# Patient Record
Sex: Male | Born: 1955 | Race: Black or African American | Hispanic: No | Marital: Married | Smoking: Current every day smoker
Health system: Southern US, Community
[De-identification: ages and names within clinical notes are randomized; demographics above are authoritative.]

## PROBLEM LIST (undated history)

## (undated) DIAGNOSIS — E119 Type 2 diabetes mellitus without complications: Secondary | ICD-10-CM

## (undated) DIAGNOSIS — E785 Hyperlipidemia, unspecified: Secondary | ICD-10-CM

## (undated) HISTORY — DX: Hyperlipidemia, unspecified: E78.5

## (undated) HISTORY — DX: Type 2 diabetes mellitus without complications: E11.9

---

## 2001-04-08 ENCOUNTER — Encounter: Payer: Self-pay | Admitting: Emergency Medicine

## 2001-04-08 ENCOUNTER — Emergency Department (HOSPITAL_COMMUNITY): Admission: EM | Admit: 2001-04-08 | Discharge: 2001-04-08 | Payer: Self-pay | Admitting: Emergency Medicine

## 2004-04-26 ENCOUNTER — Emergency Department (HOSPITAL_COMMUNITY): Admission: EM | Admit: 2004-04-26 | Discharge: 2004-04-27 | Payer: Self-pay | Admitting: Emergency Medicine

## 2004-10-11 ENCOUNTER — Emergency Department (HOSPITAL_COMMUNITY): Admission: EM | Admit: 2004-10-11 | Discharge: 2004-10-11 | Payer: Self-pay | Admitting: Emergency Medicine

## 2005-05-12 ENCOUNTER — Emergency Department (HOSPITAL_COMMUNITY): Admission: EM | Admit: 2005-05-12 | Discharge: 2005-05-12 | Payer: Self-pay | Admitting: Emergency Medicine

## 2014-02-26 ENCOUNTER — Encounter (HOSPITAL_COMMUNITY): Payer: Self-pay | Admitting: Emergency Medicine

## 2014-02-26 ENCOUNTER — Emergency Department (HOSPITAL_COMMUNITY)
Admission: EM | Admit: 2014-02-26 | Discharge: 2014-02-27 | Disposition: A | Discharge status: Home / Self Care | Payer: No Typology Code available for payment source | Attending: Emergency Medicine | Admitting: Emergency Medicine

## 2014-02-26 ENCOUNTER — Emergency Department (HOSPITAL_COMMUNITY): Payer: Self-pay

## 2014-02-26 ENCOUNTER — Emergency Department (HOSPITAL_COMMUNITY): Payer: No Typology Code available for payment source

## 2014-02-26 DIAGNOSIS — S298XXA Other specified injuries of thorax, initial encounter: Secondary | ICD-10-CM | POA: Insufficient documentation

## 2014-02-26 DIAGNOSIS — Y9389 Activity, other specified: Secondary | ICD-10-CM | POA: Insufficient documentation

## 2014-02-26 DIAGNOSIS — F17200 Nicotine dependence, unspecified, uncomplicated: Secondary | ICD-10-CM | POA: Insufficient documentation

## 2014-02-26 DIAGNOSIS — Y92410 Unspecified street and highway as the place of occurrence of the external cause: Secondary | ICD-10-CM | POA: Insufficient documentation

## 2014-02-26 MED ORDER — IOHEXOL 350 MG/ML SOLN
50.0000 mL | Freq: Once | INTRAVENOUS | Status: AC | PRN
  Administered 2014-02-26: 50 mL via INTRAVENOUS
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
## 2014-02-26 NOTE — ED Notes (Signed)
Dr.Walden at the bedside.  
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
## 2014-02-26 NOTE — ED Notes (Signed)
Called X-ray to see if patient can transport soon.  Also called CT, they anticipate him to be ready for pick up in 15 minutes. Will update family on plan of care.
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
## 2017-11-27 ENCOUNTER — Emergency Department (HOSPITAL_COMMUNITY): Payer: Self-pay

## 2017-11-27 ENCOUNTER — Emergency Department (HOSPITAL_COMMUNITY)
Admission: EM | Admit: 2017-11-27 | Discharge: 2017-11-27 | Disposition: A | Discharge status: Home / Self Care | Payer: Self-pay | Attending: Emergency Medicine | Admitting: Emergency Medicine

## 2017-11-27 ENCOUNTER — Other Ambulatory Visit: Payer: Self-pay

## 2017-11-27 ENCOUNTER — Encounter (HOSPITAL_COMMUNITY): Payer: Self-pay | Admitting: *Deleted

## 2017-11-27 DIAGNOSIS — Z5189 Encounter for other specified aftercare: Secondary | ICD-10-CM

## 2017-11-27 DIAGNOSIS — F17200 Nicotine dependence, unspecified, uncomplicated: Secondary | ICD-10-CM | POA: Insufficient documentation

## 2017-11-27 DIAGNOSIS — Y939 Activity, unspecified: Secondary | ICD-10-CM | POA: Insufficient documentation

## 2017-11-27 DIAGNOSIS — S50352A Superficial foreign body of left elbow, initial encounter: Secondary | ICD-10-CM | POA: Insufficient documentation

## 2017-11-27 DIAGNOSIS — Y999 Unspecified external cause status: Secondary | ICD-10-CM | POA: Insufficient documentation

## 2017-11-27 DIAGNOSIS — Y929 Unspecified place or not applicable: Secondary | ICD-10-CM | POA: Insufficient documentation

## 2017-11-27 DIAGNOSIS — W458XXA Other foreign body or object entering through skin, initial encounter: Secondary | ICD-10-CM | POA: Insufficient documentation

## 2017-11-27 DIAGNOSIS — W25XXXA Contact with sharp glass, initial encounter: Secondary | ICD-10-CM | POA: Insufficient documentation

## 2017-11-27 MED ORDER — BACITRACIN ZINC 500 UNIT/GM EX OINT
TOPICAL_OINTMENT | Freq: Two times a day (BID) | CUTANEOUS | Status: DC
Start: 2017-11-27 — End: 2017-11-27
  Administered 2017-11-27: 1 via TOPICAL
  Filled 2017-11-27: qty 0.9

## 2017-11-27 MED ORDER — LIDOCAINE-EPINEPHRINE (PF) 2 %-1:200000 IJ SOLN
20.0000 mL | Freq: Once | INTRAMUSCULAR | Status: AC
  Administered 2017-11-27: 20 mL
  Filled 2017-11-27: qty 20

## 2017-11-27 NOTE — ED Notes (Signed)
Patient transported to X-ray 
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
## 2017-11-27 NOTE — ED Notes (Signed)
Left arm elbow cleaned with Safe clens. Suture cart at bedside.

## 2017-12-09 ENCOUNTER — Inpatient Hospital Stay: Payer: Worker's Compensation

## 2021-08-23 ENCOUNTER — Other Ambulatory Visit: Payer: Self-pay

## 2021-08-23 ENCOUNTER — Emergency Department (HOSPITAL_COMMUNITY): Payer: BC Managed Care – PPO

## 2021-08-23 ENCOUNTER — Emergency Department (HOSPITAL_COMMUNITY)
Admission: EM | Admit: 2021-08-23 | Discharge: 2021-08-23 | Disposition: A | Discharge status: Home / Self Care | Payer: BC Managed Care – PPO | Attending: Emergency Medicine | Admitting: Emergency Medicine

## 2021-08-23 DIAGNOSIS — R042 Hemoptysis: Secondary | ICD-10-CM

## 2021-08-23 DIAGNOSIS — R059 Cough, unspecified: Secondary | ICD-10-CM | POA: Insufficient documentation

## 2021-08-23 DIAGNOSIS — F17200 Nicotine dependence, unspecified, uncomplicated: Secondary | ICD-10-CM | POA: Insufficient documentation

## 2021-08-23 LAB — CBC WITH DIFFERENTIAL/PLATELET
Abs Immature Granulocytes: 0.06 10*3/uL (ref 0.00–0.07)
Basophils Absolute: 0.1 10*3/uL (ref 0.0–0.1)
Basophils Relative: 1 %
Eosinophils Absolute: 0.2 10*3/uL (ref 0.0–0.5)
Eosinophils Relative: 2 %
HCT: 42.8 % (ref 39.0–52.0)
Hemoglobin: 14.3 g/dL (ref 13.0–17.0)
Immature Granulocytes: 1 %
Lymphocytes Relative: 35 %
Lymphs Abs: 3.1 10*3/uL (ref 0.7–4.0)
MCH: 30.4 pg (ref 26.0–34.0)
MCHC: 33.4 g/dL (ref 30.0–36.0)
MCV: 91.1 fL (ref 80.0–100.0)
Monocytes Absolute: 1.1 10*3/uL — ABNORMAL HIGH (ref 0.1–1.0)
Monocytes Relative: 12 %
Neutro Abs: 4.4 10*3/uL (ref 1.7–7.7)
Neutrophils Relative %: 49 %
Platelets: 226 10*3/uL (ref 150–400)
RBC: 4.7 MIL/uL (ref 4.22–5.81)
RDW: 12.8 % (ref 11.5–15.5)
WBC: 8.8 10*3/uL (ref 4.0–10.5)
nRBC: 0 % (ref 0.0–0.2)

## 2021-08-23 LAB — COMPREHENSIVE METABOLIC PANEL
ALT: 47 U/L — ABNORMAL HIGH (ref 0–44)
AST: 44 U/L — ABNORMAL HIGH (ref 15–41)
Albumin: 3.4 g/dL — ABNORMAL LOW (ref 3.5–5.0)
Alkaline Phosphatase: 99 U/L (ref 38–126)
Anion gap: 8 (ref 5–15)
BUN: 12 mg/dL (ref 8–23)
CO2: 24 mmol/L (ref 22–32)
Calcium: 9.3 mg/dL (ref 8.9–10.3)
Chloride: 101 mmol/L (ref 98–111)
Creatinine, Ser: 0.88 mg/dL (ref 0.61–1.24)
GFR, Estimated: >60 mL/min (ref >60)
Glucose, Bld: 207 mg/dL — ABNORMAL HIGH (ref 70–99)
Potassium: 4.3 mmol/L (ref 3.5–5.1)
Sodium: 133 mmol/L — ABNORMAL LOW (ref 135–145)
Total Bilirubin: 0.5 mg/dL (ref 0.3–1.2)
Total Protein: 8.4 g/dL — ABNORMAL HIGH (ref 6.5–8.1)

## 2021-08-23 NOTE — ED Provider Notes (Signed)
[facility]-EMERGENCY DEPT Provider Note   CSN: [id number] Arrival date & time: 08/23/21  3662     History Chief Complaint  Patient presents with   Cough   Hemoptysis    Cody Cantrell is a 65 y.o. male.  HPI    65 year old male with no significant medical history comes in with chief complaint of cough and bloody phlegm. Patient does not have a PCP.  He reports that he has been coughing for about 2 or 3 weeks.  During Thanksgiving, he was out in [place] and still had a cough at that time.  Thereafter, his cough worsened and he started having fever.  He went to an urgent care where his COVID and flu test were negative, patient was started on Ceftin and azithromycin.  He is on day 4 of the antibiotics.  2 days ago he started having blood mixed in his phlegm.  Yesterday he noted some clots.  Today the phlegm is clearer than it was yesterday.  Fevers have improved.  Patient reports that he smokes about a pack a week, and has been a heavy smoker for a while.  No night sweats, unexpected weight loss.  Besides [place], no other travel history.  He denies any wheezing.  No past medical history on file.  There are no problems to display for this patient.   No past surgical history on file.     No family history on file.  Social History   Tobacco Use   Smoking status: Every Day  Substance Use Topics   Alcohol use: Yes    Comment: occ   Drug use: No    Home Medications Prior to Admission medications   Not on File    Allergies    Patient has no known allergies.  Review of Systems   Review of Systems  Constitutional:  Positive for activity change.  Respiratory:  Positive for cough. Negative for shortness of breath.   Cardiovascular:  Negative for chest pain.  Allergic/Immunologic: Negative for immunocompromised state.  Neurological:  Negative for dizziness.  Hematological:  Does not bruise/bleed easily.   Physical Exam Updated Vital Signs BP  (!) 112/99   Pulse 71   Temp 99 F (37.2 C) (Oral)   Resp 16   Ht 5\' 7"  (1.702 m)   Wt 90.3 kg   SpO2 98%   BMI 31.17 kg/m   Physical Exam Vitals and nursing note reviewed.  Constitutional:      Appearance: He is well-developed.  HENT:     Head: Atraumatic.  Cardiovascular:     Rate and Rhythm: Normal rate.  Pulmonary:     Effort: Pulmonary effort is normal.     Breath sounds: Rhonchi present.  Musculoskeletal:     Cervical back: Neck supple.  Skin:    General: Skin is warm.  Neurological:     Mental Status: He is alert and oriented to person, place, and time.    ED Results / Procedures / Treatments   Labs (all labs ordered are listed, but only abnormal results are displayed) Labs Reviewed  CBC WITH DIFFERENTIAL/PLATELET - Abnormal; Notable for the following components:      Result Value   Monocytes Absolute 1.1 (*)    All other components within normal limits  COMPREHENSIVE METABOLIC PANEL - Abnormal; Notable for the following components:   Sodium 133 (*)    Glucose, Bld 207 (*)    Total Protein 8.4 (*)    Albumin 3.4 (*)  AST 44 (*)    ALT 47 (*)    All other components within normal limits    EKG None  Radiology DG Chest 2 View  Result Date: 08/23/2021 CLINICAL DATA:  Hemoptysis EXAM: CHEST - 2 VIEW COMPARISON:  02/26/2014 FINDINGS: Mild cardiomegaly. Heterogeneous airspace opacity of the right middle lobe with additional bandlike atelectasis or scarring. Disc degenerative disease of the thoracic spine. IMPRESSION: 1. Heterogeneous airspace opacity of the right middle lobe with additional bandlike atelectasis or scarring. This most likely reflects infection, however underlying mass is difficult to exclude. At minimum, recommend follow-up radiographs in 6-8 weeks to ensure complete radiographic resolution. 2. Mild cardiomegaly. Electronically Signed   By: Jearld Lesch M.D.   On: 08/23/2021 11:48    Procedures Procedures   Medications Ordered in  ED Medications - No data to display  ED Course  I have reviewed the triage vital signs and the nursing notes.  Pertinent labs & imaging results that were available during my care of the patient were reviewed by me and considered in my medical decision making (see chart for details).    MDM Rules/Calculators/A&P                           65 year old male comes in with chief complaint of bloody phlegm. With him is his urgent care paperwork.  At the urgent care patient was febrile and had negative COVID-19 and flu test.  He was started on Ceftin and azithromycin.  He still taking the antibiotics.  After the antibiotics was initiated, he started having some hemoptysis.  The hemoptysis has Ewald, actually improved compared to yesterday.  Patient denies any constitutional's or concerning for paraneoplastic process.  He smokes about a pack a week.  Unfortunately, patient does not have a PCP.  He has not had any kind of screening evaluation done.  His lung exam is overall reassuring besides rhonchorous breath sounds on the right side and there is no hypoxia or respiratory distress.  My suspicion is that his symptoms are likely because of the infectious process.  He however does need a follow-up x-ray and evaluation to ensure that he is improving and if needed can have more advanced imaging like CT scan.  I contacted and spoke with Dr. Elaina Pattee, internal medicine teaching service.  They will graciously contact the patient to get a follow-up.  Final Clinical Impression(s) / ED Diagnoses Final diagnoses:  Hemoptysis    Rx / DC Orders ED Discharge Orders     None        Derwood Kaplan, MD 08/23/21 1404

## 2021-08-23 NOTE — ED Triage Notes (Signed)
Patient reports he was diagnosed with pneumonia on Wednesday, started coughing up blood on Thursday.

## 2021-08-23 NOTE — Discharge Instructions (Addendum)
You are seen in the ER for bloody sputum.  We suspect that given the recent diagnosis of pneumonia and fevers, the bloody phlegm is a sequelae of that infection.  It should clear up on its own.  However, given your age and history of smoking -we do need to follow-up with the primary care doctor to ensure that the x-ray has cleared up.  We have discussed your situation with internal medicine clinic.  Expect a call from them on Monday or Tuesday to set up an appointment.  If you do not hear by Tuesday call the number provided to ensure that you are followed up within a month time.  Return to the ER if you start having worsening cough, high fevers, increased bloody phlegm, severe shortness of breath.

## 2021-08-23 NOTE — ED Provider Notes (Addendum)
Emergency Medicine Provider Triage Evaluation Note  Cody Cantrell , a 65 y.o. male  was evaluated in triage.  Pt complains of productive cough for "awhile" with the last two days being with pink sputum. The patient was recenty diagnosed with pneumonia and placed on two antibiotics. The patients denies any SOB or cough, and actually report he is coughing less. He was concerned for the sputum.  Review of Systems  Positive: Productive cough Negative: Fever, chest pain, SOB  Physical Exam  BP (!) 148/107 (BP Location: Left Arm)   Pulse 86   Temp 99 F (37.2 C) (Oral)   Resp 16   Ht 5\' 7"  (1.702 m)   Wt 90.3 kg   SpO2 99%   BMI 31.17 kg/m  Gen:   Awake, no distress   Resp:  Normal effort, satting 99% on RA, exp wheezing and rhonchi heard in bilateral bases MSK:   Moves extremities without difficulty  Other:  NAD  Medical Decision Making  Medically screening exam initiated at 10:10 AM.  Appropriate orders placed.  Manolito Jurewicz was informed that the remainder of the evaluation will be completed by another provider, this initial triage assessment does not replace that evaluation, and the importance of remaining in the ED until their evaluation is complete.  Labs ordered.   Discussed with Dr. Thurmon Fair who recommended CXR until the patient can be further evaluated in the back.    Deretha Emory, PA-C 08/23/21 1012    14/03/22, PA-C 08/23/21 1022    14/03/22, MD 08/28/21 [phone]

## 2021-08-28 ENCOUNTER — Other Ambulatory Visit: Payer: Self-pay

## 2021-08-28 ENCOUNTER — Encounter: Payer: Self-pay | Admitting: Internal Medicine

## 2021-08-28 ENCOUNTER — Ambulatory Visit (INDEPENDENT_AMBULATORY_CARE_PROVIDER_SITE_OTHER): Payer: BC Managed Care – PPO | Admitting: Internal Medicine

## 2021-08-28 VITALS — BP 130/82 | HR 67 | Temp 98.1°F | Ht 67.0 in | Wt 202.2 lb

## 2021-08-28 DIAGNOSIS — R7401 Elevation of levels of liver transaminase levels: Secondary | ICD-10-CM

## 2021-08-28 DIAGNOSIS — E1169 Type 2 diabetes mellitus with other specified complication: Secondary | ICD-10-CM | POA: Diagnosis not present

## 2021-08-28 DIAGNOSIS — Z299 Encounter for prophylactic measures, unspecified: Secondary | ICD-10-CM

## 2021-08-28 DIAGNOSIS — J189 Pneumonia, unspecified organism: Secondary | ICD-10-CM | POA: Diagnosis not present

## 2021-08-28 DIAGNOSIS — R739 Hyperglycemia, unspecified: Secondary | ICD-10-CM | POA: Diagnosis not present

## 2021-08-28 DIAGNOSIS — J159 Unspecified bacterial pneumonia: Secondary | ICD-10-CM

## 2021-08-28 DIAGNOSIS — E785 Hyperlipidemia, unspecified: Secondary | ICD-10-CM

## 2021-08-28 DIAGNOSIS — Z23 Encounter for immunization: Secondary | ICD-10-CM | POA: Diagnosis not present

## 2021-08-28 NOTE — Patient Instructions (Addendum)
[name], it was a pleasure seeing you today! You endorsed feeling well today. Below are some of the things we talked about this visit. We look forward to seeing you in the follow up appointment!  Today we discussed: Pneumonia: You came to the clinic after going to the ED with pneumonia. Please finish your antibiotics. We will order repeat chest x ray to make sure it has resolved.   Preventive: We gave the flu shot. We will order lab work to make sure you don't have diabetes. We will also refer you to GI doctor for a colonoscopy.   I have ordered the following labs today:  Lab Orders         Acute Viral Hepatitis (HAV, HBV, HCV)         Lipid Profile         HIV antibody (with reflex)         Hemoglobin A1c        Referrals ordered today:   Referral Orders         Ambulatory referral to Gastroenterology       I have ordered the following medication/changed the following medications:   Stop the following medications: There are no discontinued medications.   Start the following medications: No orders of the defined types were placed in this encounter.    Follow-up: We will call you regarding a follow up.   Please make sure to arrive 15 minutes prior to your next appointment. If you arrive late, you may be asked to reschedule.   We look forward to seeing you next time. Please call our clinic at [phone] if you have any questions or concerns. The best time to call is Monday-Friday from 9am-4pm, but there is someone available 24/7. If after hours or the weekend, call the main hospital number and ask for the Internal Medicine Resident On-Call. If you need medication refills, please notify your pharmacy one week in advance and they will send Cody Cantrell a request.  Thank you for letting Cody Cantrell take part in your care. Wishing you the best!  Thank you, Gwenevere Abbot, MD

## 2021-08-28 NOTE — Progress Notes (Signed)
  CC: ED follow up/Establish Care  HPI:  Cody Cantrell is a 65 y.o. male with a past medical history stated below and presents today for ED follow up. Please see problem based assessment and plan for additional details.  Past Medical Hx: No medical hx.   Medications:  No current medications  Family Hx:  No known family hx.   Social History:  Patient lives with wife and mom. He works at a Location [employer]. He has been smoking for 30 years with 1 pack per week. He drinks occasionally with an average of 4 cans of beers per week and 2-3 mix drinks per week.   Review of Systems: ROS negative except for what is noted on the assessment and plan.  Vitals:   08/28/21 1353  BP: 130/82  Pulse: 67  Temp: 98.1 F (36.7 C)  TempSrc: Oral  SpO2: 99%  Weight: 202 lb 3.2 oz (91.7 kg)  Height: 5\' 7"  (1.702 m)   Height: 5\' 7"  (1.702 m)  Physical Exam General: Well-developed, well-nourished, appearing stated age. Head: Normocephalic without scalp lesions.  Eyes: Conjunctivae pink, sclerae white, without icterus.  Mouth and Throat: Lips normal color, without lesions. Moist mucus membrane. Neck: Neck supple with full range of motion (ROM). Lungs: Rhonchi present but no wheeze or rales. Good air movement to the bases.  Cardiovascular: Normal heart sounds, no r/m/g, 2+ pulses in all extremities Abdomen: No TTP, normal bowel sounds MSK: No asymmetry or muscle atrophy. Full range of motion (ROM) of all joints. No injuries noted. Strength 5/5 in all extremities.  Skin: warm, dry good skin turgor, no lesions Neuro: Alert and oriented. CN grossly intact Psych: Normal mood and normal affect     Assessment & Plan:   See Encounters Tab for problem based charting.  Patient seen with Dr. , M.D. [facility] Internal Medicine, PGY-1 Pager: [phone]

## 2021-08-29 LAB — LIPID PANEL
Chol/HDL Ratio: 4.5 ratio (ref 0.0–5.0)
Cholesterol, Total: 176 mg/dL (ref 100–199)
HDL: 39 mg/dL — ABNORMAL LOW (ref >39)
LDL Chol Calc (NIH): 124 mg/dL — ABNORMAL HIGH (ref 0–99)
Triglycerides: 66 mg/dL (ref 0–149)
VLDL Cholesterol Cal: 13 mg/dL (ref 5–40)

## 2021-08-29 LAB — HEMOGLOBIN A1C
Est. average glucose Bld gHb Est-mCnc: 283 mg/dL
Hgb A1c MFr Bld: 11.5 % — ABNORMAL HIGH (ref 4.8–5.6)

## 2021-08-29 LAB — HIV ANTIBODY (ROUTINE TESTING W REFLEX): HIV Screen 4th Generation wRfx: NONREACTIVE

## 2021-08-29 LAB — HEPATITIS B SURFACE ANTIBODY,QUALITATIVE: Hep B Surface Ab, Qual: NONREACTIVE

## 2021-08-29 LAB — HEPATITIS C ANTIBODY: Hep C Virus Ab: <0.1 s/co ratio (ref 0.0–0.9)

## 2021-08-30 DIAGNOSIS — E785 Hyperlipidemia, unspecified: Secondary | ICD-10-CM | POA: Insufficient documentation

## 2021-08-30 DIAGNOSIS — Z299 Encounter for prophylactic measures, unspecified: Secondary | ICD-10-CM | POA: Insufficient documentation

## 2021-08-30 DIAGNOSIS — J159 Unspecified bacterial pneumonia: Secondary | ICD-10-CM | POA: Insufficient documentation

## 2021-08-30 DIAGNOSIS — Z1211 Encounter for screening for malignant neoplasm of colon: Secondary | ICD-10-CM | POA: Insufficient documentation

## 2021-08-30 DIAGNOSIS — R7401 Elevation of levels of liver transaminase levels: Secondary | ICD-10-CM | POA: Insufficient documentation

## 2021-08-30 DIAGNOSIS — E119 Type 2 diabetes mellitus without complications: Secondary | ICD-10-CM | POA: Insufficient documentation

## 2021-08-30 MED ORDER — ROSUVASTATIN CALCIUM 20 MG PO TABS: 20.0000 mg | ORAL_TABLET | Freq: Every day | ORAL | 0 refills | Status: DC

## 2021-08-30 MED ORDER — METFORMIN HCL 500 MG PO TABS: ORAL_TABLET | ORAL | 0 refills | Status: DC

## 2021-08-30 NOTE — Assessment & Plan Note (Addendum)
In this visit, a flu vaccine, hep C, HIV and a colonoscopy were ordered. Patient stated he had both of his COVID shots.

## 2021-08-30 NOTE — Assessment & Plan Note (Addendum)
Assessment/Plan: Patient had elevated liver enzymes. Patient denied any abdominal pain or other GI symptoms. DDx included NASH vs alcoholic hepatitis vs hepatits B vs hepatitis C. 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 His alcohol use may be contributing as well. Patient agreed to further workup including a RUQ ultrasound.  -Hep B, Hep C -TSH pending -RUQ ultrasound  Addendum: Hepatitis B and C are negative. Patient informed of the results.

## 2021-08-30 NOTE — Assessment & Plan Note (Addendum)
Assessment/Plan: Patient was found to have hyperlipidemia on screening labs. He denied having a family hx of hyperlipidemia. His ASCVD risk was calculated and determined to be 29%. -Lifestyle modifications -Crestor 20 mg qd -Follow up in one month  Addendum: Patient was called regarding the result and informed of the new medication. He agreed to a follow up in one month.

## 2021-08-30 NOTE — Assessment & Plan Note (Signed)
Assessment/Plan: Patient had hyperglycemia on CMP in the ED. He denied any family hx of diabetes or symptoms consistent with diabetes. -A1c ordered  Addendum: A1c was 11.5. I informed the patient about his A1c results and started him on Metformin 1000 mg with instructions to increase it in increments of 500 mg weekly until dose of 1000 mg BID was reached. Advised patient to make lifestyle changes and follow up in the clinic in one month.

## 2021-08-30 NOTE — Assessment & Plan Note (Signed)
Assessment/Plan: Patient presents after an ED follow up for bacterial pneumonia. His symptoms have resolved except for a mild cough. He still has 2 doses of the abx left. Advised patient to complete antibiotics and we will repeat CXR in one month. -Complete abx -Repeat CXR in one month

## 2021-09-01 NOTE — Progress Notes (Signed)
Internal Medicine Clinic Attending  I saw and evaluated the patient.  I personally confirmed the key portions of the history and exam documented by Dr. Welton Flakes and I reviewed pertinent patient test results.  The assessment, diagnosis, and plan were formulated together and I agree with the documentation in the resident's note. Lungs sound clear on exam, patient reports no further hemoptysis and near resolution of cough. Plan for repeat CXR in 6 weeks to evaluate resolution of prior findings given inability to rule out mass on previous CXR.

## 2021-09-29 ENCOUNTER — Encounter: Payer: Self-pay | Admitting: Internal Medicine

## 2021-09-29 ENCOUNTER — Ambulatory Visit (INDEPENDENT_AMBULATORY_CARE_PROVIDER_SITE_OTHER): Payer: BC Managed Care – PPO | Admitting: Internal Medicine

## 2021-09-29 ENCOUNTER — Other Ambulatory Visit: Payer: Self-pay

## 2021-09-29 ENCOUNTER — Ambulatory Visit
Admission: RE | Admit: 2021-09-29 | Discharge: 2021-09-29 | Disposition: A | Discharge status: Home / Self Care | Payer: BC Managed Care – PPO | Source: Ambulatory Visit | Attending: Internal Medicine | Admitting: Internal Medicine

## 2021-09-29 VITALS — BP 114/74 | HR 62 | Temp 98.5°F | Resp 24 | Ht 67.0 in | Wt 197.0 lb

## 2021-09-29 DIAGNOSIS — K760 Fatty (change of) liver, not elsewhere classified: Secondary | ICD-10-CM | POA: Diagnosis not present

## 2021-09-29 DIAGNOSIS — R7401 Elevation of levels of liver transaminase levels: Secondary | ICD-10-CM

## 2021-09-29 DIAGNOSIS — J159 Unspecified bacterial pneumonia: Secondary | ICD-10-CM | POA: Diagnosis not present

## 2021-09-29 DIAGNOSIS — E1169 Type 2 diabetes mellitus with other specified complication: Secondary | ICD-10-CM | POA: Diagnosis not present

## 2021-09-29 DIAGNOSIS — Z299 Encounter for prophylactic measures, unspecified: Secondary | ICD-10-CM

## 2021-09-29 DIAGNOSIS — E785 Hyperlipidemia, unspecified: Secondary | ICD-10-CM

## 2021-09-29 NOTE — Assessment & Plan Note (Signed)
Patient is due for pneumococcal vaccine, shingles vaccine, COVID-vaccine.  Discussed vaccines with patient today.  Patient will consider and let us know next appointment.  Referral to ophthalmology ordered today.  Urine microalbumin ordered and collected today.

## 2021-09-29 NOTE — Progress Notes (Signed)
°  CC: Follow-up for new diagnosis of type 2 diabetes and hyperlipidemia  HPI:  Cody Cantrell is a 66 y.o. male with a past medical history stated below and presents today for follow-up from type 2 diabetes and hyperlipidemia. Please see problem based assessment and plan for additional details.  Past Medical History:  Diagnosis Date   Hyperlipidemia    Type 2 diabetes mellitus (HCC)     Current Outpatient Medications on File Prior to Visit  Medication Sig Dispense Refill   metFORMIN (GLUCOPHAGE) 500 MG tablet Take 1 tablet (500 mg total) by mouth 2 (two) times daily with a meal for 7 days, THEN 1.5 tablets (750 mg total) 2 (two) times daily with a meal for 7 days, THEN 2 tablets (1,000 mg total) 2 (two) times daily with a meal. 219 tablet 0   rosuvastatin (CRESTOR) 20 MG tablet Take 1 tablet (20 mg total) by mouth daily. 90 tablet 0   No current facility-administered medications on file prior to visit.    Social History: Works for a Aeronautical engineer.  Review of Systems: ROS negative except for what is noted on the assessment and plan.  Vitals:   09/29/21 0957 09/29/21 1001  BP: 120/82 114/74  Pulse: 62 62  Resp: (!) 24   Temp: 98.5 F (36.9 C)   TempSrc: Oral   SpO2: 100%   Weight: 197 lb (89.4 kg)   Height: 5\' 7"  (1.702 m)      Physical Exam: General: Well appearing African-American male, NAD HENT: normocephalic, atraumatic, MMM EYES: conjunctiva non-erythematous, no scleral icterus CV: regular rate, normal rhythm, no murmurs, rubs, gallops. Pulmonary: normal work of breathing on RA, lungs clear to auscultation, no rales, wheezes, rhonchi Abdominal: non-distended, soft, non-tender to palpation, normal BS Skin: Warm and dry, no rashes or lesions Neurological: MS: awake, alert and oriented x3, normal speech and fund of knowledge Motor: moves all extremities antigravity Psych: normal affect  Foot exam documented in chart    Assessment & Plan:   See Encounters  Tab for problem based charting.  Patient discussed with Dr. , M.D. [facility] Internal Medicine, PGY-1 Pager: [phone] Date 09/29/2021 Time 12:13 PM

## 2021-09-29 NOTE — Assessment & Plan Note (Signed)
Completed course of antibiotics.  No further respiratory symptoms.

## 2021-09-29 NOTE — Patient Instructions (Signed)
Thank you, [name] for allowing [place] to provide your care today. Today we discussed:  Type 2 diabetes mellitus: Thank you for bringing in your blood sugars today.  Looks like you are doing well on your metformin 1000 mg twice daily.  Continue to take your current regimen.  I expect your loose stools will continue to improve.  If needed you may take Imodium which you can get at a local pharmacy to help with loose bowel movements.  Today we did a foot exam.  I have also referred you to an ophthalmologist to do an eye exam.  We have also done a urine protein study to take a look at how your kidneys are doing.  I have also referred you to our diabetes coordinator.  You should expect a call from our office to schedule a meeting with her.  Also expect a call from ophthalmology to set up an appointment.  High cholesterol: Continue taking your rosuvastatin.  Vaccines: You are due for a pneumococcal vaccine to prevent pneumonia.  You are also due for a shingles vaccine to prevent the shingles rash.  You are also due for a COVID booster shot.  Let us know if you are interested in these vaccines.  The COVID shot and shingles shot you would get at a local pharmacy.  I have ordered the following labs for you:   Lab Orders         Microalbumin / Creatinine Urine Ratio      My Chart Access: [email or web address]  Please follow-up in 6 months  Please make sure to arrive 15 minutes prior to your next appointment. If you arrive late, you may be asked to reschedule.    We look forward to seeing you next time. Please call our clinic at [phone] if you have any questions or concerns. The best time to call is Monday-Friday from 9am-4pm, but there is someone available 24/7. If after hours or the weekend, call the main hospital number and ask for the Internal Medicine Resident On-Call. If you need medication refills, please notify your pharmacy one week in advance and  they will send [place] a request.   Thank you for letting us take part in your care. Wishing you the best!  Ellison Carwin, MD 09/29/2021, 10:41 AM IM Resident, PGY-1

## 2021-09-29 NOTE — Assessment & Plan Note (Signed)
See assessment and plan under NAFL

## 2021-09-29 NOTE — Assessment & Plan Note (Addendum)
Patient presents for OV for follow-up after new diagnosis of type 2 diabetes mellitus.  Patient currently on metformin 1000 mg twice daily.  Patient endorses adherence to this, steadily increased his dose to 4 tablets daily.  Has had some loose stools since starting metformin, reports these are getting better though still having every day loose stools.  Discussed intermittent use of Imodium as needed for loose stools, expect this will improve.  Patient has a glucometer, and checks his daily morning fasting glucose.  He brings his log today.  On review patient's CBGs 150s-200 in early December, 110s-160s late December.  Today he denies polydipsia, burning tingling numbness in hands and feet, blurry vision.  Discussed importance of diabetic diet and exercise.  Performed foot exam today.  Discussed importance of yearly eye exams and yearly urine test.  Plan: -Continue metformin 1000 mg twice daily, Imodium as needed for loose stools -Referral to ophthalmology -Urine microalbumin today -Foot exam completed today -Referral to diabetes coordinator -Continue CBG checks -Continue lifestyle modifications  ADDENDUM: Microalbumin/creatinine ratio 19 (normal)

## 2021-09-29 NOTE — Assessment & Plan Note (Signed)
Last lipid panel 08/28/2021 with cholesterol 176, triglycerides 66, HDL 39, LDL 124.  Patient was started on rosuvastatin 20 daily at last OV.  Patient endorses adherence.  Most effective medication.  Plan: -Continue Crestor 20 mg daily -Lifestyle modifications

## 2021-09-29 NOTE — Assessment & Plan Note (Addendum)
Patient has history of mild transaminitis. CMP from 08/23/2021 showed AST 44, ALT 47, alk phos 99, total bilirubin 0.5.  At last OV, hep C, hep B, and RUQ ultrasound were ordered.  Hep C and hep B negative.  RUQ ultrasound showed diffuse increased echogenicity of the hepatic parenchyma which is a nonspecific indicator of hepatocellular dysfunction, most commonly seen in steatosis.  Discussed ultrasound findings with patient.  Discussed continued lifestyle modifications.  Plan: -Continue treatment of T2DM and HLD -Continue lifestyle modifications -Consider repeat LFTs in 3 to 6 months after initiation of above changes to guide further evaluation of transaminitis and ultrasound findings.

## 2021-09-30 LAB — MICROALBUMIN / CREATININE URINE RATIO
Creatinine, Urine: 148.7 mg/dL
Microalb/Creat Ratio: 19 mg/g creat (ref 0–29)
Microalbumin, Urine: 27.7 ug/mL

## 2021-09-30 NOTE — Progress Notes (Signed)
Internal Medicine Clinic Attending ? ?Case discussed with Dr. Zinoviev  At the time of the visit.  We reviewed the resident?s history and exam and pertinent patient test results.  I agree with the assessment, diagnosis, and plan of care documented in the resident?s note.  ?

## 2021-10-03 ENCOUNTER — Telehealth: Payer: Self-pay | Admitting: Internal Medicine

## 2021-10-03 NOTE — Telephone Encounter (Signed)
Called to remind Cody Cantrell of need for repeat CXR.  He states he will come in next week to have this completed. Message forwarded to PCP for f/u.

## 2021-10-07 ENCOUNTER — Other Ambulatory Visit: Payer: Self-pay

## 2021-10-07 ENCOUNTER — Ambulatory Visit (HOSPITAL_COMMUNITY)
Admission: RE | Admit: 2021-10-07 | Discharge: 2021-10-07 | Disposition: A | Discharge status: Home / Self Care | Payer: BC Managed Care – PPO | Source: Ambulatory Visit | Attending: Internal Medicine | Admitting: Internal Medicine

## 2021-10-07 DIAGNOSIS — J189 Pneumonia, unspecified organism: Secondary | ICD-10-CM | POA: Diagnosis present

## 2021-10-21 ENCOUNTER — Encounter: Payer: BC Managed Care – PPO | Admitting: Dietician

## 2021-11-04 ENCOUNTER — Other Ambulatory Visit: Payer: Self-pay | Admitting: *Deleted

## 2021-11-04 DIAGNOSIS — E785 Hyperlipidemia, unspecified: Secondary | ICD-10-CM

## 2021-11-04 DIAGNOSIS — E1169 Type 2 diabetes mellitus with other specified complication: Secondary | ICD-10-CM

## 2021-11-04 NOTE — Telephone Encounter (Signed)
Pt was here at the office requesting refills on his meds. Reminded pt the doctors are given 48 hrs to refill meds - he understands. Also informed [facility]

## 2021-11-06 MED ORDER — ROSUVASTATIN CALCIUM 20 MG PO TABS: 20.0000 mg | ORAL_TABLET | Freq: Every day | ORAL | 1 refills | Status: DC

## 2021-11-06 MED ORDER — METFORMIN HCL 500 MG PO TABS: 1000.0000 mg | ORAL_TABLET | Freq: Two times a day (BID) | ORAL | 7 refills | Status: DC

## 2021-11-06 NOTE — Telephone Encounter (Signed)
Called pt to inform him of refills but vm has not been set up, unable to leave a message.

## 2021-12-26 ENCOUNTER — Other Ambulatory Visit: Payer: Self-pay | Admitting: Student

## 2021-12-26 DIAGNOSIS — E1169 Type 2 diabetes mellitus with other specified complication: Secondary | ICD-10-CM

## 2021-12-30 NOTE — Telephone Encounter (Signed)
Please schedule pt a f/u visit per Dr Humphrey Rolls. ?Thanks ?

## 2022-02-10 ENCOUNTER — Emergency Department (HOSPITAL_COMMUNITY)
Admission: EM | Admit: 2022-02-10 | Discharge: 2022-02-11 | Disposition: A | Discharge status: Home / Self Care | Payer: BC Managed Care – PPO | Attending: Student | Admitting: Student

## 2022-02-10 DIAGNOSIS — Z7984 Long term (current) use of oral hypoglycemic drugs: Secondary | ICD-10-CM | POA: Diagnosis not present

## 2022-02-10 DIAGNOSIS — M79644 Pain in right finger(s): Secondary | ICD-10-CM | POA: Diagnosis not present

## 2022-02-10 DIAGNOSIS — F17210 Nicotine dependence, cigarettes, uncomplicated: Secondary | ICD-10-CM | POA: Insufficient documentation

## 2022-02-10 DIAGNOSIS — T7840XA Allergy, unspecified, initial encounter: Secondary | ICD-10-CM | POA: Diagnosis present

## 2022-02-10 DIAGNOSIS — L509 Urticaria, unspecified: Secondary | ICD-10-CM

## 2022-02-10 DIAGNOSIS — E119 Type 2 diabetes mellitus without complications: Secondary | ICD-10-CM | POA: Diagnosis not present

## 2022-02-10 MED ORDER — KETOROLAC TROMETHAMINE 15 MG/ML IJ SOLN
15.0000 mg | Freq: Once | INTRAMUSCULAR | Status: AC
  Administered 2022-02-10: 15 mg via INTRAVENOUS
  Filled 2022-02-10: qty 1

## 2022-02-10 MED ORDER — METHYLPREDNISOLONE SODIUM SUCC 125 MG IJ SOLR
125.0000 mg | Freq: Once | INTRAMUSCULAR | Status: AC
  Administered 2022-02-10: 125 mg via INTRAVENOUS
  Filled 2022-02-10: qty 2

## 2022-02-10 MED ORDER — FAMOTIDINE IN NACL 20-0.9 MG/50ML-% IV SOLN
20.0000 mg | Freq: Once | INTRAVENOUS | Status: AC
  Administered 2022-02-10: 20 mg via INTRAVENOUS
  Filled 2022-02-10: qty 50

## 2022-02-10 NOTE — ED Provider Notes (Signed)
11:54 PM Assumed care from Dr. Matilde Sprang, please see their note for full history, physical and decision making until this point. In brief this is a 66 y.o. year old male who presented to the ED tonight with Allergic Reaction     Apparently reacted to a wasp sting. Rash. Got meds around 8. Cleared at midnight.   On reeval, patient appears well. No airway issues. Lungs clear. Rash gone. No GI symptoms. No globus sensation or other abnormalities. Stable for d/c with PRN Benadryl.  Discharge instructions, including strict return precautions for new or worsening symptoms, given. Patient and/or family verbalized understanding and agreement with the plan as described.   Labs, studies and imaging reviewed by myself and considered in medical decision making if ordered. Imaging interpreted by radiology.  Labs Reviewed - No data to display  No orders to display    No follow-ups on file.    Merrily Pew, MD 02/10/22 [phone]

## 2022-02-10 NOTE — ED Triage Notes (Signed)
Pt BIB EMS for a bee sting about 2 hours ago, pt  took 50 mg benadryl prior to EMS arrival. Hives noted on the pt's chest, denies SOB and throat swelling. 0.3mg  epi IM given and decrease in hives  Pt reports a similar episode 6 years ago   90 palp, 106/64 74 HR  14 RR 95 RA  166 CBG

## 2022-02-10 NOTE — ED Provider Notes (Signed)
[facility] EMERGENCY DEPARTMENT Provider Note  CSN: [id number] Arrival date & time: 02/10/22 2124  Chief Complaint(s) Allergic Reaction  HPI Cody Cantrell is a 66 y.o. male with PMH T2DM, HLD who presents emergency department for evaluation of an allergic reaction.  Patient states that approximately 1 hour prior to arrival he was stung on the finger by a wasp.  He had progressive onset of hives and called EMS.  At no point did he have any shortness of breath, wheezing or throat swelling, but the patient did receive 0.3 mg of IM epinephrine and the patient took 50 mg of p.o. Benadryl prior to arrival.  He denies chest pain, shortness of breath, headache, fever, abdominal pain or other systemic symptoms.   Past Medical History Past Medical History:  Diagnosis Date   Hyperlipidemia    Type 2 diabetes mellitus (HCC)    Patient Active Problem List   Diagnosis Date Noted   NAFL (nonalcoholic fatty liver) 09/29/2021   Bacterial pneumonia 08/30/2021   Diabetes mellitus, type 2 (HCC) 08/30/2021   Hyperlipidemia 08/30/2021   Preventive measure 08/30/2021   Transaminitis 08/30/2021   Home Medication(s) Prior to Admission medications   Medication Sig Start Date End Date Taking? Authorizing Provider  metFORMIN (GLUCOPHAGE) 1000 MG tablet Take 1 tablet (1,000 mg total) by mouth 2 (two) times daily with a meal. 12/30/21 03/30/22  Gwenevere Abbot, MD  rosuvastatin (CRESTOR) 20 MG tablet Take 1 tablet (20 mg total) by mouth daily. 11/06/21   Steffanie Rainwater, MD                                                                                                                                    Past Surgical History No past surgical history on file. Family History No family history on file.  Social History Social History   Tobacco Use   Smoking status: Every Day    Packs/day: 0.10    Types: Cigarettes   Tobacco comments:    1 pk per week   Substance Use Topics   Alcohol  use: Yes    Comment: occ   Drug use: No   Allergies Patient has no known allergies.  Review of Systems Review of Systems  Skin:  Positive for rash.   Physical Exam Vital Signs  I have reviewed the triage vital signs BP 116/80   Pulse 76   Temp 97.9 F (36.6 C) (Oral)   Ht 5\' 7"  (1.702 m)   Wt 84.8 kg   SpO2 93%   BMI 29.29 kg/m   Physical Exam Vitals and nursing note reviewed.  Constitutional:      General: He is not in acute distress.    Appearance: He is well-developed.  HENT:     Head: Normocephalic and atraumatic.  Eyes:     Conjunctiva/sclera: Conjunctivae normal.  Cardiovascular:     Rate and Rhythm: Normal rate and regular rhythm.  Heart sounds: No murmur heard. Pulmonary:     Effort: Pulmonary effort is normal. No respiratory distress.     Breath sounds: Normal breath sounds.  Abdominal:     Palpations: Abdomen is soft.     Tenderness: There is no abdominal tenderness.  Musculoskeletal:        General: Tenderness (Third digit) present. No swelling.     Cervical back: Neck supple.  Skin:    General: Skin is warm and dry.     Capillary Refill: Capillary refill takes less than 2 seconds.     Findings: Rash (Urticaria) present.  Neurological:     Mental Status: He is alert.  Psychiatric:        Mood and Affect: Mood normal.    ED Results and Treatments Labs (all labs ordered are listed, but only abnormal results are displayed) Labs Reviewed - No data to display                                                                                                                        Radiology No results found.  Pertinent labs & imaging results that were available during my care of the patient were reviewed by me and considered in my medical decision making (see MDM for details).  Medications Ordered in ED Medications  famotidine (PEPCID) IVPB 20 mg premix (20 mg Intravenous New Bag/Given 02/10/22 2204)  methylPREDNISolone sodium succinate  (SOLU-MEDROL) 125 mg/2 mL injection 125 mg (125 mg Intravenous Given 02/10/22 2158)  ketorolac (TORADOL) 15 MG/ML injection 15 mg (15 mg Intravenous Given 02/10/22 2158)                                                                                                                                     Procedures Procedures  (including critical care time)  Medical Decision Making / ED Course   This patient presents to the ED for concern of allergic reaction, this involves an extensive number of treatment options, and is a complaint that carries with it a high risk of complications and morbidity.  The differential diagnosis includes allergic reaction, urticaria, anaphylaxis  MDM: Patient seen emergency room for evaluation of allergic reaction after a wasp sting.  Physical exam reveals tenderness of the third digit at the site of the wasp sting and urticaria over the chest and back but no oropharyngeal swelling, no wheezing or other physical exam signs of  anaphylaxis.  Patient given steroids and Pepcid as well as Toradol for his hand pain.  Patient will require an observation stay in the emergency department after epinephrine administration and will be safe for discharge at midnight.  Patient then signed out on come provider.  Please see provider signout for continuation of work-up.  Anticipate discharge home.   Additional history obtained: -Additional history obtained from multiple family members -External records from outside source obtained and reviewed including: Chart review including previous notes, labs, imaging, consultation notes    Medicines ordered and prescription drug management: Meds ordered this encounter  Medications   methylPREDNISolone sodium succinate (SOLU-MEDROL) 125 mg/2 mL injection 125 mg   famotidine (PEPCID) IVPB 20 mg premix   ketorolac (TORADOL) 15 MG/ML injection 15 mg    -I have reviewed the patients home medicines and have made adjustments as needed  Critical  interventions none   Cardiac Monitoring: The patient was maintained on a cardiac monitor.  I personally viewed and interpreted the cardiac monitored which showed an underlying rhythm of: NSR  Social Determinants of Health:  Factors impacting patients care include: none   Reevaluation: After the interventions noted above, I reevaluated the patient and found that they have :improved  Co morbidities that complicate the patient evaluation  Past Medical History:  Diagnosis Date   Hyperlipidemia    Type 2 diabetes mellitus (HCC)       Dispostion: I considered admission for this patient, and disposition pending completion of observation period.  Please see provider note for continuation of work-up anticipate discharge.     Final Clinical Impression(s) / ED Diagnoses Final diagnoses:  None     @PCDICTATION @    Glendora ScoreKommor, Sarahmarie Leavey, MD 02/10/22 2318

## 2022-02-18 NOTE — Progress Notes (Signed)
   CC: follow up  HPI:  [name] is a 66 y.o. with medical history as below presenting to [facility] for follow up.  Please see problem-based list for further details, assessments, and plans.  Past Medical History:  Diagnosis Date   Hyperlipidemia    Type 2 diabetes mellitus (HCC)    Review of Systems:  Review of system negative unless stated in the problem list or HPI.    Physical Exam:  Vitals:   02/19/22 1437  BP: 116/63  Pulse: 64  Temp: 98.1 F (36.7 C)  TempSrc: Oral  SpO2: 99%  Weight: 193 lb 12.8 oz (87.9 kg)  Height: 5\' 7"  (1.702 m)    Physical Exam General: Well-developed, NAD Head: Normocephalic without scalp lesions.  Eyes: PERRLA, Conjunctivae pink, sclerae white, without icterus.  Mouth and Throat: Lips normal color, without lesions. Moist mucus membrane. Neck: Neck supple with full range of motion (ROM). No masses or tenderness. Jugular venous distension (JVD) normal. Trachea midline.  Lungs: CTAB, no wheeze, rhonchi or rales.  Cardiovascular: Normal heart sounds, no r/m/g, 2+ pulses in all extremities. No LE edema Abdomen: No TTP, normal bowel sounds MSK: No asymmetry or muscle atrophy. Full range of motion (ROM) of all joints. No injuries noted. Strength 5/5 in all extremities.  Skin: warm, dry good skin turgor, no lesions noted Neuro: Alert and oriented. CN grossly intact Psych: Normal mood and normal affect   Assessment & Plan:   See Encounters Tab for problem based charting.  Patient discussed with Dr. , MD

## 2022-02-19 ENCOUNTER — Other Ambulatory Visit: Payer: Self-pay

## 2022-02-19 ENCOUNTER — Ambulatory Visit (INDEPENDENT_AMBULATORY_CARE_PROVIDER_SITE_OTHER): Payer: BC Managed Care – PPO | Admitting: Internal Medicine

## 2022-02-19 ENCOUNTER — Encounter: Payer: Self-pay | Admitting: Internal Medicine

## 2022-02-19 VITALS — BP 116/63 | HR 64 | Temp 98.1°F | Ht 67.0 in | Wt 193.8 lb

## 2022-02-19 DIAGNOSIS — E1169 Type 2 diabetes mellitus with other specified complication: Secondary | ICD-10-CM | POA: Diagnosis not present

## 2022-02-19 DIAGNOSIS — E785 Hyperlipidemia, unspecified: Secondary | ICD-10-CM

## 2022-02-19 DIAGNOSIS — R7401 Elevation of levels of liver transaminase levels: Secondary | ICD-10-CM

## 2022-02-19 DIAGNOSIS — Z299 Encounter for prophylactic measures, unspecified: Secondary | ICD-10-CM

## 2022-02-19 DIAGNOSIS — Z7984 Long term (current) use of oral hypoglycemic drugs: Secondary | ICD-10-CM

## 2022-02-19 DIAGNOSIS — Z1211 Encounter for screening for malignant neoplasm of colon: Secondary | ICD-10-CM

## 2022-02-19 LAB — POCT GLYCOSYLATED HEMOGLOBIN (HGB A1C): Hemoglobin A1C: 7 % — AB (ref 4.0–5.6)

## 2022-02-19 LAB — GLUCOSE, CAPILLARY: Glucose-Capillary: 97 mg/dL (ref 70–99)

## 2022-02-19 NOTE — Patient Instructions (Signed)
Cody Cantrell, it was a pleasure seeing you today! You endorsed feeling well today. Below are some of the things we talked about this visit. We look forward to seeing you in the follow up appointment!  Today we discussed: It was a pleasure taking care of you!  You were here for a follow-up for your diabetes and high cholesterol.  Your repeat A1c was 7 which is a significant improvement.  I would like to congratulate you on this achievement.  Please continue taking the metformin.  We will check some labs today and make any adjustments to your cholesterol medications if needed.  We will also refer you to gastroenterology for colonoscopy.  I encourage you to continue the dietary changes that you have made and start exercising to improve the weight loss and maximize the health benefits.  I have ordered the following labs today:   Lab Orders         Glucose, capillary         Lipid Profile         CMP14 + Anion Gap         POC Hbg A1C       Referrals ordered today:    Referral Orders         Ambulatory referral to Gastroenterology      I have ordered the following medication/changed the following medications:   Stop the following medications: There are no discontinued medications.   Start the following medications: No orders of the defined types were placed in this encounter.    Follow-up: around 6 month follow up with me   Please make sure to arrive 15 minutes prior to your next appointment. If you arrive late, you may be asked to reschedule.   We look forward to seeing you next time. Please call our clinic at [phone] if you have any questions or concerns. The best time to call is Monday-Friday from 9am-4pm, but there is someone available 24/7. If after hours or the weekend, call the main hospital number and ask for the Internal Medicine Resident On-Call. If you need medication refills, please notify your pharmacy one week in advance and they will send [place] a request.  Thank  you for letting us take part in your care. Wishing you the best!  Thank you, Gwenevere Abbot, MD

## 2022-02-20 LAB — LIPID PANEL
Chol/HDL Ratio: 2.4 ratio (ref 0.0–5.0)
Cholesterol, Total: 105 mg/dL (ref 100–199)
HDL: 44 mg/dL (ref >39)
LDL Chol Calc (NIH): 49 mg/dL (ref 0–99)
Triglycerides: 47 mg/dL (ref 0–149)
VLDL Cholesterol Cal: 12 mg/dL (ref 5–40)

## 2022-02-20 LAB — CMP14 + ANION GAP
ALT: 20 IU/L (ref 0–44)
AST: 19 IU/L (ref 0–40)
Albumin/Globulin Ratio: 1.7 (ref 1.2–2.2)
Albumin: 4.3 g/dL (ref 3.8–4.8)
Alkaline Phosphatase: 77 IU/L (ref 44–121)
Anion Gap: 17 mmol/L (ref 10.0–18.0)
BUN/Creatinine Ratio: 11 (ref 10–24)
BUN: 9 mg/dL (ref 8–27)
Bilirubin Total: <0.2 mg/dL (ref 0.0–1.2)
CO2: 22 mmol/L (ref 20–29)
Calcium: 9.3 mg/dL (ref 8.6–10.2)
Chloride: 103 mmol/L (ref 96–106)
Creatinine, Ser: 0.83 mg/dL (ref 0.76–1.27)
Globulin, Total: 2.6 g/dL (ref 1.5–4.5)
Glucose: 80 mg/dL (ref 70–99)
Potassium: 4 mmol/L (ref 3.5–5.2)
Sodium: 142 mmol/L (ref 134–144)
Total Protein: 6.9 g/dL (ref 6.0–8.5)
eGFR: 98 mL/min/{1.73_m2} (ref >59)

## 2022-02-21 NOTE — Assessment & Plan Note (Signed)
Resolved on recent CMP with AST/ALT of 19/20.

## 2022-02-21 NOTE — Assessment & Plan Note (Signed)
Assessment/Plan Controlled. A1c 7.0 today, 11.5 in 08/2021. CBG 97 today.  Improving on current regimen. Reports good medication compliance. Tolerating medication without adverse effects. Counseled on the benefits of daily exercise, limiting processed foods and high sugar foods, and weight loss. No hypoglycemic episodes. Denies polydipsia, polyuria, weight loss, lethargy, blurry vision, and changes in sensation. No wounds or ulcer of bilateral feet. Benign physical exam and vitals wnl.   -Continue metformin 1000 mg BID -Continue lifestyle modifications -Foot exam done and normal with no lesions, good pulses, and intact sensation.

## 2022-02-21 NOTE — Assessment & Plan Note (Signed)
Preventive Medicine Eye exam-Opthalmopalogy visit upcoming Colonoscopy-GI referral placed this visit Shingles-counseled on getting at pharmacy

## 2022-02-21 NOTE — Assessment & Plan Note (Signed)
Patient has HLD with goal of secondary prevention. Last lipid panel on 08/2021 showed Chol 176, LDL 124 . Reports compliance to Crestor 20 mg without adverse effects. Counseled on the importance of continued lifestyle modifications, including: weight loss, daily exercise, and healthy diet with limited processed and fatty foods. Lipid panel ordered showed  Chole 105, LDL 49 -Continue Crestor 20 mg daily -Encouraged to continue lifestyle modifications

## 2022-02-23 MED ORDER — ROSUVASTATIN CALCIUM 20 MG PO TABS: 20.0000 mg | ORAL_TABLET | Freq: Every day | ORAL | 3 refills | Status: DC

## 2022-02-23 NOTE — Addendum Note (Signed)
Addended by: Gwenevere Abbot on: 02/23/2022 09:10 AM   Modules accepted: Orders

## 2022-02-23 NOTE — Progress Notes (Signed)
Internal Medicine Clinic Attending  Case discussed with Dr. Khan  At the time of the visit.  We reviewed the resident's history and exam and pertinent patient test results.  I agree with the assessment, diagnosis, and plan of care documented in the resident's note.  

## 2022-02-23 NOTE — Addendum Note (Signed)
Addended by: Charise Killian on: 02/23/2022 09:52 AM   Modules accepted: Level of Service

## 2022-02-24 ENCOUNTER — Ambulatory Visit (INDEPENDENT_AMBULATORY_CARE_PROVIDER_SITE_OTHER): Payer: BC Managed Care – PPO | Admitting: Student

## 2022-02-24 ENCOUNTER — Encounter: Payer: Self-pay | Admitting: Student

## 2022-02-24 DIAGNOSIS — L03012 Cellulitis of left finger: Secondary | ICD-10-CM | POA: Diagnosis not present

## 2022-02-24 MED ORDER — MUPIROCIN 2 % EX OINT: 1.0000 "application " | TOPICAL_OINTMENT | Freq: Three times a day (TID) | CUTANEOUS | 1 refills | Status: DC

## 2022-02-24 NOTE — Assessment & Plan Note (Signed)
Patient presents today for evaluation of possible infection around left ring fingernail. Patient reports he has had some swelling around his left middle finger for about 3 to 4 weeks.  He denies any trauma to the finger.  He does not have significant pain around the finger and has been able to work without any problems. However last night, he noticed some drainage coming from the nailbed. The swelling has slightly progressed over the last few days. He denies any systemic symptoms such as fevers, chills, muscle aches, loss of appetite or nausea. On exam, patient has mild edema, warmth and erythema around the nailbed of the left third finger consistent with paronychia. There is no fluctuant or drainable abscess.  Finger is neurovascularly intact. Normal range of motion. Plan to manage his conservatively for now and consider I&D if patient develops significant abscess around the nailbed.  Patient advised to wear gloves at all times during work.  Plan: -Start bacitracin ointment, apply 3 times daily -Instructed to soak finger in warm water up to 3-4 times a day -Apply warm compression to finger to help with drainage -Advised to follow-up in clinic if symptoms worsen or if you develop systemic symptoms

## 2022-02-24 NOTE — Patient Instructions (Signed)
Thank you, [name] for allowing [place] to provide your care today. Today we discussed your finger swelling. You are starting to develop something called paronychia, which can affect the toenails or the fingernails.  Follow the following instructions:  -- Soak your finger in warm water up to 3-4 times a day --Apply warm compression to the finger to help it drain  I have ordered the following medication/changed the following medications:  Started on bacitracin ointment, applied to finger 3 times daily  My Chart Access: [email or web address]  Please follow-up as needed  Please make sure to arrive 15 minutes prior to your next appointment. If you arrive late, you may be asked to reschedule.    We look forward to seeing you next time. Please call our clinic at [phone] if you have any questions or concerns. The best time to call is Monday-Friday from 9am-4pm, but there is someone available 24/7. If after hours or the weekend, call the main hospital number and ask for the Internal Medicine Resident On-Call. If you need medication refills, please notify your pharmacy one week in advance and they will send [place] a request.   Thank you for letting us take part in your care. Wishing you the best!  Steffanie Rainwater, MD 02/24/2022, 10:42 AM IM Resident, PGY-2 Duwayne Heck 41:10

## 2022-02-24 NOTE — Progress Notes (Signed)
   CC: Swelling of ring finger  HPI:  [name] is a 65 y.o. male with PMH as below who presents to clinic with a complaint of infected left ring finger. Please see problem based charting for evaluation, assessment and plan.  Past Medical History:  Diagnosis Date   Hyperlipidemia    Type 2 diabetes mellitus (HCC)     Review of Systems:  Constitutional: Negative for fever or chills. MSK: Positive for swelling and redness around the left ring fingernail. Negative for hand pain or trauma. Neuro: Negative for numbness and tingling of the finger.  Physical Exam: General: Pleasant, well-appearing elderly man.  No acute distress. Cardiac: RRR. No murmurs, rubs or gallops. No LE edema Respiratory: Lungs CTAB. No wheezing or crackles. MSK: Mild edema, warmth and erythema around nailbed of L 3rd finger. No fluctuance, purulent or drainable abscess. No ttp of the nailbed. NVI Skin: Warm, dry and intact without rashes or lesions Neuro: A&O x 3. Moves all extremities. Normal sensation to gross touch.   Vitals:   02/24/22 0959 02/24/22 1014  BP: (!) 156/135 112/76  Pulse: 75 68  Temp: 98.2 F (36.8 C)   TempSrc: Oral   SpO2: 98%   Weight: 192 lb (87.1 kg)   Height: 5\' 7"  (1.702 m)      Assessment & Plan:   See Encounters Tab for problem based charting.  Patient discussed with Dr.  , MD, MPH

## 2022-02-25 NOTE — Progress Notes (Signed)
Internal Medicine Clinic Attending  Case discussed with Dr. [place]  At the time of the visit.  We reviewed the resident's history and exam and pertinent patient test results.  I agree with the assessment, diagnosis, and plan of care documented in the resident's note.  

## 2022-03-02 ENCOUNTER — Other Ambulatory Visit: Payer: Self-pay | Admitting: Internal Medicine

## 2022-03-02 DIAGNOSIS — E1169 Type 2 diabetes mellitus with other specified complication: Secondary | ICD-10-CM

## 2022-03-02 NOTE — Telephone Encounter (Signed)
Patient requesting that medication be sent to a different Pharmacy.  [facility] [address].

## 2022-03-02 NOTE — Telephone Encounter (Signed)
Pt states his medications were sent to the wrong pharmacy and needs it to be corrected. Pt  requesting that Both of the following prescriptions be sent to    metFORMIN (GLUCOPHAGE) 1000 MG tablet mupirocin ointment (BACTROBAN) 2 %  [id number] - [place], [place] - [address]

## 2022-03-04 MED ORDER — MUPIROCIN 2 % EX OINT: 1.0000 "application " | TOPICAL_OINTMENT | Freq: Three times a day (TID) | CUTANEOUS | 1 refills | Status: DC

## 2022-03-04 MED ORDER — METFORMIN HCL 1000 MG PO TABS: 1000.0000 mg | ORAL_TABLET | Freq: Two times a day (BID) | ORAL | 0 refills | Status: DC

## 2022-04-09 LAB — HM DIABETES EYE EXAM

## 2022-04-27 ENCOUNTER — Encounter: Payer: Self-pay | Admitting: Dietician

## 2022-05-11 ENCOUNTER — Emergency Department (HOSPITAL_COMMUNITY)
Admission: EM | Admit: 2022-05-11 | Discharge: 2022-05-12 | Disposition: A | Discharge status: Home / Self Care | Payer: BC Managed Care – PPO | Attending: Emergency Medicine | Admitting: Emergency Medicine

## 2022-05-11 ENCOUNTER — Other Ambulatory Visit: Payer: Self-pay

## 2022-05-11 DIAGNOSIS — Z7984 Long term (current) use of oral hypoglycemic drugs: Secondary | ICD-10-CM | POA: Insufficient documentation

## 2022-05-11 DIAGNOSIS — T782XXA Anaphylactic shock, unspecified, initial encounter: Secondary | ICD-10-CM

## 2022-05-11 DIAGNOSIS — R220 Localized swelling, mass and lump, head: Secondary | ICD-10-CM | POA: Diagnosis not present

## 2022-05-11 DIAGNOSIS — T63441A Toxic effect of venom of bees, accidental (unintentional), initial encounter: Secondary | ICD-10-CM | POA: Diagnosis not present

## 2022-05-11 DIAGNOSIS — E119 Type 2 diabetes mellitus without complications: Secondary | ICD-10-CM | POA: Insufficient documentation

## 2022-05-11 MED ORDER — FAMOTIDINE IN NACL 20-0.9 MG/50ML-% IV SOLN
20.0000 mg | Freq: Once | INTRAVENOUS | Status: AC
  Administered 2022-05-11: 20 mg via INTRAVENOUS
  Filled 2022-05-11: qty 50

## 2022-05-11 MED ORDER — EPINEPHRINE 0.3 MG/0.3ML IJ SOAJ: 0.3000 mg | INTRAMUSCULAR | 0 refills | Status: AC | PRN

## 2022-05-11 MED ORDER — METHYLPREDNISOLONE SODIUM SUCC 125 MG IJ SOLR
125.0000 mg | Freq: Once | INTRAMUSCULAR | Status: AC
  Administered 2022-05-11: 125 mg via INTRAVENOUS
  Filled 2022-05-11: qty 2

## 2022-05-11 MED ORDER — LIDOCAINE 5 % EX PTCH
1.0000 | MEDICATED_PATCH | CUTANEOUS | Status: DC
  Administered 2022-05-11: 1 via TRANSDERMAL
  Filled 2022-05-11: qty 1

## 2022-05-11 MED ORDER — PREDNISONE 20 MG PO TABS: 20.0000 mg | ORAL_TABLET | Freq: Every day | ORAL | 0 refills | Status: AC

## 2022-05-11 NOTE — ED Provider Notes (Signed)
[facility] EMERGENCY DEPARTMENT Provider Note   CSN: [id number] Arrival date & time: 05/11/22  2052     History  Chief Complaint  Patient presents with   Allergic Reaction    Cody Cantrell is a 66 y.o. male.  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 Pt reports he was outside when suddenly he was stung by a bee in his left side.  He immediately started itching.  He took 50mg  of benadryl at home but swelling and itching got worse. When EMS arrived pt had facial swelling, tachypnea and began getting GI sx with hypotension.  He was given epi and fluid bolus.  Now pt reports he feels better.  He denies any SOB or tongue swelling or voice changes.  He did not receive epi pen prescription with last episode.  Still having stinging and pain at the site of sting.  The history is provided by the patient.  Allergic Reaction      Home Medications Prior to Admission medications   Medication Sig Start Date End Date Taking? Authorizing Provider  metFORMIN (GLUCOPHAGE) 1000 MG tablet Take 1 tablet (1,000 mg total) by mouth 2 (two) times daily with a meal. 03/04/22 06/02/22  08/02/22, MD  mupirocin ointment (BACTROBAN) 2 % Apply 1 application  topically 3 (three) times daily. Apply to finger three times daily 03/04/22   03/06/22, MD  rosuvastatin (CRESTOR) 20 MG tablet Take 1 tablet (20 mg total) by mouth daily. 02/23/22 02/23/23  04/25/23, MD      Allergies    Bee venom    Review of Systems   Review of Systems  Physical Exam Updated Vital Signs BP 126/74   Pulse 97   Temp (!) 97.5 F (36.4 C) (Temporal)   Resp 19   SpO2 96%  Physical Exam Vitals and nursing note reviewed.  Constitutional:      General: He is not in acute distress.    Appearance: He is well-developed.  HENT:     Head: Normocephalic and atraumatic.     Mouth/Throat:     Comments: No tongue  swelling or uvular edema Eyes:     Conjunctiva/sclera: Conjunctivae normal.     Pupils: Pupils are equal, round, and reactive to light.  Cardiovascular:     Rate and Rhythm: Normal rate and regular rhythm.     Heart sounds: No murmur heard. Pulmonary:     Effort: Pulmonary effort is normal. No respiratory distress.     Breath sounds: Normal breath sounds. No wheezing or rales.  Chest:    Abdominal:     General: There is no distension.     Palpations: Abdomen is soft.     Tenderness: There is no abdominal tenderness. There is no guarding or rebound.  Musculoskeletal:        General: No tenderness. Normal range of motion.     Cervical back: Normal range of motion and neck supple.  Skin:    General: Skin is warm and dry.     Findings: No erythema or rash.  Neurological:     Mental Status: He is alert and oriented to person, place, and time.  Psychiatric:        Behavior: Behavior normal.     ED Results / Procedures / Treatments   Labs (all labs ordered are listed, but only abnormal results are displayed) Labs  Reviewed - No data to display  EKG None  Radiology No results found.  Procedures Procedures    Medications Ordered in ED Medications  lidocaine (LIDODERM) 5 % 1 patch (1 patch Transdermal Patch Applied 05/11/22 2117)  methylPREDNISolone sodium succinate (SOLU-MEDROL) 125 mg/2 mL injection 125 mg (125 mg Intravenous Given 05/11/22 2117)  famotidine (PEPCID) IVPB 20 mg premix (0 mg Intravenous Stopped 05/11/22 2153)    ED Course/ Medical Decision Making/ A&P                           Medical Decision Making Risk Prescription drug management.   Pt presenting after anaphlaxsis to bee sting tonight at 6:30pm.  Pt seen by ems and received IVF and epi pen at 2033.  Pt on arrival here is feeling better.  VS stable but initially hypertensive most likely related to epi.  Pt has still uricaria and edema where bee sting was.  Placed lidoderm patch and pt given  solumedrol, pepcid.  Pt is clear at 12:30.  11:39 PM Pt still feeling well at this time.  Will receive epipen ppx and short course of steroids for localized reaction from bee sting as well.        Final Clinical Impression(s) / ED Diagnoses Final diagnoses:  None    Rx / DC Orders ED Discharge Orders     None         Cody Sprout, MD 05/11/22 2341

## 2022-05-11 NOTE — Discharge Instructions (Addendum)
You can use benadryl every 6 hours as needed for itching and rash.  You can start the short course of steroids tomorrow for the swelling and pain where the bee stung you.

## 2022-05-11 NOTE — ED Triage Notes (Addendum)
Pt here from home via [facility] after getting stung by a bee on L flank approx 1.5 hours ago. Pt had hives on his hands at first, took 50mg  benadryl. Pt denies shob, but per EMS pt had increased WOB w/ wheezing. Pt's BP decreased to 80/45, EMS gave NS, 0.5mg  epi (@ 2033), 5mg  albuterol. Pt denies pain, last BP 116/84.

## 2022-05-12 NOTE — ED Provider Notes (Signed)
Patient has been monitored in the ER for several hours without any deterioration.  He is feeling improved. He is now safe for discharge home.  I discussed at length with patient and his family about appropriate use of EpiPen and when to call 911.  Prescriptions had already been sent to his pharmacy   Zadie Rhine, MD 05/12/22 [phone]

## 2022-06-10 ENCOUNTER — Other Ambulatory Visit: Payer: Self-pay | Admitting: Student

## 2022-06-10 DIAGNOSIS — E785 Hyperlipidemia, unspecified: Secondary | ICD-10-CM

## 2022-06-10 DIAGNOSIS — E1169 Type 2 diabetes mellitus with other specified complication: Secondary | ICD-10-CM

## 2022-06-22 ENCOUNTER — Ambulatory Visit: Payer: BC Managed Care – PPO

## 2022-06-25 ENCOUNTER — Emergency Department (HOSPITAL_COMMUNITY): Payer: BC Managed Care – PPO

## 2022-06-25 ENCOUNTER — Emergency Department (HOSPITAL_COMMUNITY)
Admission: EM | Admit: 2022-06-25 | Discharge: 2022-06-26 | Disposition: A | Discharge status: Home / Self Care | Payer: BC Managed Care – PPO | Attending: Emergency Medicine | Admitting: Emergency Medicine

## 2022-06-25 ENCOUNTER — Other Ambulatory Visit: Payer: Self-pay

## 2022-06-25 ENCOUNTER — Encounter (HOSPITAL_COMMUNITY): Payer: Self-pay

## 2022-06-25 DIAGNOSIS — R079 Chest pain, unspecified: Secondary | ICD-10-CM | POA: Insufficient documentation

## 2022-06-25 DIAGNOSIS — R042 Hemoptysis: Secondary | ICD-10-CM

## 2022-06-25 DIAGNOSIS — Z20822 Contact with and (suspected) exposure to covid-19: Secondary | ICD-10-CM | POA: Insufficient documentation

## 2022-06-25 DIAGNOSIS — R058 Other specified cough: Secondary | ICD-10-CM

## 2022-06-25 DIAGNOSIS — F1490 Cocaine use, unspecified, uncomplicated: Secondary | ICD-10-CM

## 2022-06-25 DIAGNOSIS — R591 Generalized enlarged lymph nodes: Secondary | ICD-10-CM

## 2022-06-25 DIAGNOSIS — R059 Cough, unspecified: Secondary | ICD-10-CM | POA: Insufficient documentation

## 2022-06-25 DIAGNOSIS — E041 Nontoxic single thyroid nodule: Secondary | ICD-10-CM

## 2022-06-25 DIAGNOSIS — I3139 Other pericardial effusion (noninflammatory): Secondary | ICD-10-CM

## 2022-06-25 DIAGNOSIS — J189 Pneumonia, unspecified organism: Secondary | ICD-10-CM

## 2022-06-25 LAB — CBC
HCT: 41.2 % (ref 39.0–52.0)
Hemoglobin: 13.9 g/dL (ref 13.0–17.0)
MCH: 31.2 pg (ref 26.0–34.0)
MCHC: 33.7 g/dL (ref 30.0–36.0)
MCV: 92.4 fL (ref 80.0–100.0)
Platelets: 176 10*3/uL (ref 150–400)
RBC: 4.46 MIL/uL (ref 4.22–5.81)
RDW: 13.2 % (ref 11.5–15.5)
WBC: 14.1 10*3/uL — ABNORMAL HIGH (ref 4.0–10.5)
nRBC: 0 % (ref 0.0–0.2)

## 2022-06-25 LAB — COMPREHENSIVE METABOLIC PANEL
ALT: 21 U/L (ref 0–44)
AST: 33 U/L (ref 15–41)
Albumin: 3.8 g/dL (ref 3.5–5.0)
Alkaline Phosphatase: 60 U/L (ref 38–126)
Anion gap: 11 (ref 5–15)
BUN: 14 mg/dL (ref 8–23)
CO2: 26 mmol/L (ref 22–32)
Calcium: 9.5 mg/dL (ref 8.9–10.3)
Chloride: 101 mmol/L (ref 98–111)
Creatinine, Ser: 1.04 mg/dL (ref 0.61–1.24)
GFR, Estimated: >60 mL/min (ref >60)
Glucose, Bld: 92 mg/dL (ref 70–99)
Potassium: 3.7 mmol/L (ref 3.5–5.1)
Sodium: 138 mmol/L (ref 135–145)
Total Bilirubin: 0.9 mg/dL (ref 0.3–1.2)
Total Protein: 7.9 g/dL (ref 6.5–8.1)

## 2022-06-25 LAB — SARS CORONAVIRUS 2 BY RT PCR: SARS Coronavirus 2 by RT PCR: NEGATIVE

## 2022-06-25 LAB — TYPE AND SCREEN
ABO/RH(D): O POS
Antibody Screen: NEGATIVE

## 2022-06-25 LAB — LIPASE, BLOOD: Lipase: 26 U/L (ref 11–51)

## 2022-06-25 LAB — CBG MONITORING, ED: Glucose-Capillary: 90 mg/dL (ref 70–99)

## 2022-06-25 LAB — ETHANOL: Alcohol, Ethyl (B): <10 mg/dL (ref <10)

## 2022-06-25 LAB — TROPONIN I (HIGH SENSITIVITY)
Troponin I (High Sensitivity): 12 ng/L (ref <18)
Troponin I (High Sensitivity): 12 ng/L (ref <18)

## 2022-06-25 MED ORDER — SODIUM CHLORIDE 0.9 % IV SOLN
500.0000 mg | Freq: Once | INTRAVENOUS | Status: AC
  Administered 2022-06-25: 500 mg via INTRAVENOUS
  Filled 2022-06-25: qty 5

## 2022-06-25 MED ORDER — SODIUM CHLORIDE 0.9 % IV SOLN
1.0000 g | Freq: Once | INTRAVENOUS | Status: AC
  Administered 2022-06-25: 1 g via INTRAVENOUS
  Filled 2022-06-25: qty 10

## 2022-06-25 MED ORDER — AZITHROMYCIN 250 MG PO TABS: 250.0000 mg | ORAL_TABLET | Freq: Every day | ORAL | 0 refills | Status: DC

## 2022-06-25 MED ORDER — CEFDINIR 300 MG PO CAPS: 300.0000 mg | ORAL_CAPSULE | Freq: Two times a day (BID) | ORAL | 0 refills | Status: DC

## 2022-06-25 MED ORDER — SODIUM CHLORIDE 0.9 % IV BOLUS
1000.0000 mL | Freq: Once | INTRAVENOUS | Status: AC
  Administered 2022-06-25: 1000 mL via INTRAVENOUS

## 2022-06-25 MED ORDER — IOHEXOL 350 MG/ML SOLN
65.0000 mL | Freq: Once | INTRAVENOUS | Status: AC | PRN
  Administered 2022-06-25: 65 mL via INTRAVENOUS

## 2022-06-25 MED ORDER — ONDANSETRON HCL 4 MG/2ML IJ SOLN
4.0000 mg | Freq: Once | INTRAMUSCULAR | Status: AC
  Administered 2022-06-25: 4 mg via INTRAVENOUS
  Filled 2022-06-25: qty 2

## 2022-06-25 MED ORDER — PANTOPRAZOLE 80MG IVPB - SIMPLE MED
80.0000 mg | Freq: Once | INTRAVENOUS | Status: AC
  Administered 2022-06-25: 80 mg via INTRAVENOUS
  Filled 2022-06-25: qty 100

## 2022-06-25 NOTE — ED Triage Notes (Addendum)
Patient reports started coughing up blood yesterday. Patient can't sit still complaining of chest pain as well.   Family reports he is a drinker and things he has taken some drug.   Reports she found him like this, this morning.

## 2022-06-25 NOTE — ED Provider Triage Note (Signed)
Emergency Medicine Provider Triage Evaluation Note  Cody Cantrell , a 66 y.o. male  was evaluated in triage.  Pt complains of hemoptysis, chest pain.  Patient reports that for the last 1 week he has been doing crack cocaine every day.  The patient reports that his chest pain began last night.  The patient states he has never had chest pain with this before.  The patient is alert and oriented x4.  Patient neurological examination shows no focal neurodeficits.  Patient unable to remain still during examination.  Review of Systems  Positive:  Negative:   Physical Exam  BP 129/73 (BP Location: Left Arm)   Pulse 83   Temp 98.6 F (37 C) (Oral)   Resp (!) 30   Ht 5\' 7"  (1.702 m)   Wt 87.1 kg   SpO2 96%   BMI 30.07 kg/m  Gen:   Awake, no distress   Resp:  Normal effort  MSK:   Moves extremities without difficulty  Other:  Lung sounds clear.  No focal neurodeficits on examination neurologically.  Medical Decision Making  Medically screening exam initiated at 12:40 PM.  Appropriate orders placed.  Kimberlee Nearing was informed that the remainder of the evaluation will be completed by another provider, this initial triage assessment does not replace that evaluation, and the importance of remaining in the ED until their evaluation is complete.     Azucena Cecil, PA-C 06/25/22 1241

## 2022-06-25 NOTE — ED Provider Notes (Signed)
[facility] EMERGENCY DEPARTMENT Provider Note   CSN: [id number] Arrival date & time: 06/25/22  1144     History  Chief Complaint  Patient presents with   Hemoptysis   Chest Pain    Cody Cantrell is a 66 y.o. male.  Pt c/o cough, occasionally productive of small amount phlegm and blood. Symptoms acute onset yesterday. Denies sore throat or runny nose. No fever or chills. Notes recent cocaine used in past week. States had chest pain last night, constant, dull, mid chest, non radiating, not pleuritic.  No associated sob, nv or diaphoresis. Denies any exertional chest pain or discomfort. No sob or unusual doe. Nausea, no vomiting. Denies change in stools/stool color, melena or hematochezia. No abd pain or distension. Denies hx pud or gi bleeding. No anticoagulant use. No fever or chills. No faintness.   The history is provided by the patient and medical records. The history is limited by the condition of the patient.  Chest Pain Associated symptoms: cough   Associated symptoms: no abdominal pain, no back pain, no fever, no headache, no palpitations, no shortness of breath and no vomiting        Home Medications Prior to Admission medications   Medication Sig Start Date End Date Taking? Authorizing Provider  EPINEPHrine 0.3 mg/0.3 mL IJ SOAJ injection Inject 0.3 mg into the muscle as needed for anaphylaxis. 05/11/22   [name], MD  metFORMIN (GLUCOPHAGE) 1000 MG tablet Take 1 tablet (1,000 mg total) by mouth 2 (two) times daily with a meal. 03/04/22 06/02/22  Gwenevere Abbot, MD  mupirocin ointment (BACTROBAN) 2 % Apply 1 application  topically 3 (three) times daily. Apply to finger three times daily 03/04/22   Gwenevere Abbot, MD  rosuvastatin (CRESTOR) 20 MG tablet TAKE 1 TABLET BY MOUTH EVERY DAY 06/11/22   Gwenevere Abbot, MD      Allergies    Bee venom    Review of Systems   Review of Systems  Constitutional:  Negative for chills and fever.  HENT:  Negative  for nosebleeds and sore throat.   Eyes:  Negative for redness.  Respiratory:  Positive for cough. Negative for shortness of breath.   Cardiovascular:  Positive for chest pain. Negative for palpitations and leg swelling.  Gastrointestinal:  Negative for abdominal pain, diarrhea and vomiting.  Genitourinary:  Negative for flank pain.  Musculoskeletal:  Negative for back pain and neck pain.  Skin:  Negative for rash.  Neurological:  Negative for headaches.  Hematological:  Does not bruise/bleed easily.  Psychiatric/Behavioral:  Negative for confusion.     Physical Exam Updated Vital Signs BP (!) 139/127 (BP Location: Right Arm)   Pulse (!) 53   Temp 98.4 F (36.9 C) (Oral)   Resp 14   Ht 1.702 m (5\' 7" )   Wt 87.1 kg   SpO2 (!) 80%   BMI 30.07 kg/m  Physical Exam Vitals and nursing note reviewed.  Constitutional:      Appearance: Normal appearance. He is well-developed.  HENT:     Head: Atraumatic.     Nose: Nose normal.     Mouth/Throat:     Mouth: Mucous membranes are moist.     Pharynx: Oropharynx is clear.  Eyes:     General: No scleral icterus.    Conjunctiva/sclera: Conjunctivae normal.  Neck:     Trachea: No tracheal deviation.  Cardiovascular:     Rate and Rhythm: Normal rate and regular rhythm.     Pulses:  Normal pulses.     Heart sounds: Normal heart sounds. No murmur heard.    No friction rub. No gallop.  Pulmonary:     Effort: Pulmonary effort is normal. No accessory muscle usage or respiratory distress.     Breath sounds: Normal breath sounds.  Abdominal:     General: Bowel sounds are normal. There is no distension.     Palpations: Abdomen is soft. There is no mass.     Tenderness: There is no abdominal tenderness. There is no guarding.  Genitourinary:    Comments: No cva tenderness. Musculoskeletal:        General: No swelling or tenderness.     Cervical back: Normal range of motion and neck supple. No rigidity.     Right lower leg: No edema.      Left lower leg: No edema.  Skin:    General: Skin is warm and dry.     Findings: No rash.  Neurological:     Mental Status: He is alert.     Comments: Alert, speech clear.   Psychiatric:        Mood and Affect: Mood normal.     ED Results / Procedures / Treatments   Labs (all labs ordered are listed, but only abnormal results are displayed) Results for orders placed or performed during the hospital encounter of 06/25/22  SARS Coronavirus 2 by RT PCR (hospital order, performed in [facility] hospital lab) *cepheid single result test* Anterior Nasal Swab   Specimen: Anterior Nasal Swab  Result Value Ref Range   SARS Coronavirus 2 by RT PCR NEGATIVE NEGATIVE  Comprehensive metabolic panel  Result Value Ref Range   Sodium 138 135 - 145 mmol/L   Potassium 3.7 3.5 - 5.1 mmol/L   Chloride 101 98 - 111 mmol/L   CO2 26 22 - 32 mmol/L   Glucose, Bld 92 70 - 99 mg/dL   BUN 14 8 - 23 mg/dL   Creatinine, Ser 6.14 0.61 - 1.24 mg/dL   Calcium 9.5 8.9 - 43.1 mg/dL   Total Protein 7.9 6.5 - 8.1 g/dL   Albumin 3.8 3.5 - 5.0 g/dL   AST 33 15 - 41 U/L   ALT 21 0 - 44 U/L   Alkaline Phosphatase 60 38 - 126 U/L   Total Bilirubin 0.9 0.3 - 1.2 mg/dL   GFR, Estimated >54 >00 mL/min   Anion gap 11 5 - 15  CBC  Result Value Ref Range   WBC 14.1 (H) 4.0 - 10.5 K/uL   RBC 4.46 4.22 - 5.81 MIL/uL   Hemoglobin 13.9 13.0 - 17.0 g/dL   HCT 86.7 61.9 - 50.9 %   MCV 92.4 80.0 - 100.0 fL   MCH 31.2 26.0 - 34.0 pg   MCHC 33.7 30.0 - 36.0 g/dL   RDW 32.6 71.2 - 45.8 %   Platelets 176 150 - 400 K/uL   nRBC 0.0 0.0 - 0.2 %  Lipase, blood  Result Value Ref Range   Lipase 26 11 - 51 U/L  Ethanol  Result Value Ref Range   Alcohol, Ethyl (B) <10 <10 mg/dL  CBG monitoring, ED  Result Value Ref Range   Glucose-Capillary 90 70 - 99 mg/dL  Type and screen [facility]  Result Value Ref Range   ABO/RH(D) O POS    Antibody Screen NEG    Sample Expiration      06/28/2022,2359 Performed  at [facility], [address]  [place], [place] [address]   Troponin I (High Sensitivity)  Result Value Ref Range   Troponin I (High Sensitivity) 12 <18 ng/L  Troponin I (High Sensitivity)  Result Value Ref Range   Troponin I (High Sensitivity) 12 <18 ng/L   DG Chest 2 View  Result Date: 06/25/2022 CLINICAL DATA:  Chest pain.  Hemoptysis. EXAM: CHEST - 2 VIEW COMPARISON:  October 07, 2021. FINDINGS: Stable cardiomediastinal silhouette. Mild bibasilar atelectasis or infiltrates are noted. Bony thorax is unremarkable. IMPRESSION: Mild bibasilar subsegmental atelectasis or infiltrates are noted. Electronically Signed   By: Lupita Raider M.D.   On: 06/25/2022 12:29     EKG EKG Interpretation  Date/Time:  Thursday June 25 2022 12:37:43 EDT Ventricular Rate:  70 PR Interval:  144 QRS Duration: 86 QT Interval:  416 QTC Calculation: 449 R Axis:   35 Text Interpretation: Normal sinus rhythm Non-specific ST-t changes No previous ECGs available Confirmed by Cathren Laine ([id number]) on 06/25/2022 4:55:46 PM  Radiology CT Angio Chest PE W/Cm &/Or Wo Cm  Result Date: 06/25/2022 CLINICAL DATA:  Suspected pulmonary embolism. EXAM: CT ANGIOGRAPHY CHEST WITH CONTRAST TECHNIQUE: Multidetector CT imaging of the chest was performed using the standard protocol during bolus administration of intravenous contrast. Multiplanar CT image reconstructions and MIPs were obtained to evaluate the vascular anatomy. RADIATION DOSE REDUCTION: This exam was performed according to the departmental dose-optimization program which includes automated exposure control, adjustment of the mA and/or kV according to patient size and/or use of iterative reconstruction technique. CONTRAST:  10mL OMNIPAQUE IOHEXOL 350 MG/ML SOLN COMPARISON:  Chest x-ray same day FINDINGS: Cardiovascular: Heart is borderline enlarged. There is trace pericardial fluid. The aorta is normal in size. There is adequate opacification of the  pulmonary arteries to the segmental level. There is no evidence for pulmonary embolism. Mediastinum/Nodes: There is an enlarged AP window lymph node measuring 12 mm short axis. No other enlarged lymph nodes are seen. There is a right thyroid nodule measuring 18 mm. Visualized esophagus is within normal limits. Lungs/Pleura: Mild paraseptal emphysematous changes are present. There is patchy airspace disease throughout the left lower lobe compatible with pneumonia. There is also minimal patchy airspace disease in the inferior left upper lobe. No pleural effusion or pneumothorax. Upper Abdomen: No acute abnormality. Musculoskeletal: No chest wall abnormality. No acute or significant osseous findings. Review of the MIP images confirms the above findings. IMPRESSION: 1. No evidence for pulmonary embolism. 2. Patchy airspace disease in the left lower lobe and inferior left upper lobe compatible with pneumonia. 3. Single enlarged AP window lymph node, likely reactive. 4. Trace pericardial fluid. 5. Right thyroid nodule measuring 18 mm. Emphysema (ICD10-J43.9). Electronically Signed   By: Darliss Cheney M.D.   On: 06/25/2022 21:29   DG Chest 2 View  Result Date: 06/25/2022 CLINICAL DATA:  Chest pain.  Hemoptysis. EXAM: CHEST - 2 VIEW COMPARISON:  October 07, 2021. FINDINGS: Stable cardiomediastinal silhouette. Mild bibasilar atelectasis or infiltrates are noted. Bony thorax is unremarkable. IMPRESSION: Mild bibasilar subsegmental atelectasis or infiltrates are noted. Electronically Signed   By: Lupita Raider M.D.   On: 06/25/2022 12:29    Procedures Procedures    Medications Ordered in ED Medications - No data to display  ED Course/ Medical Decision Making/ A&P                           Medical Decision Making Problems Addressed: Cocaine use: acute illness or injury with systemic  symptoms that poses a threat to life or bodily functions Community acquired pneumonia of left lower lobe of lung: acute  illness or injury with systemic symptoms that poses a threat to life or bodily functions Hemoptysis: acute illness or injury with systemic symptoms that poses a threat to life or bodily functions Lymphadenopathy: acute illness or injury Pericardial effusion: acute illness or injury Productive cough: acute illness or injury Right thyroid nodule: chronic illness or injury    Details: Needs f/u  Amount and/or Complexity of Data Reviewed Independent Historian:     Details: Family/friend, hx External Data Reviewed: notes. Labs: ordered. Decision-making details documented in ED Course. Radiology: ordered and independent interpretation performed. Decision-making details documented in ED Course. ECG/medicine tests: ordered and independent interpretation performed. Decision-making details documented in ED Course.  Risk Prescription drug management. Decision regarding hospitalization.  Iv ns. Continuous pulse ox and cardiac monitoring. Labs ordered/sent. Imaging ordered.   Diff dx includes acs, msk cp, bronchitis, pna, pe, etc - dispo decision including potential need for admission considered - will get labs and imaging and reassess.   Reviewed nursing notes and prior charts for additional history. External reports reviewed.   Cardiac monitor: sinus rhythm, rate 70.  Labs reviewed/interpreted by me - trop normal. Hgb normal.   Xrays reviewed/interpreted by me -  ?atelectasis.   Given hemoptysis, cp - will get CTA r/o PE  CT reviewed/interpreted by me - no PE. +pna. Incidental findings shared w pt.  Rocephin and zithromax iv.   Iv ns bolus. Zofran iv. Po fluids.   Recheck, no recurrent hemoptysis during obs in ED. Pt breathing comfortably, sats 96% room air, no increased wob.   Pt currently appears stable for d/c.   Rec close pcp f/u.  Return precautions provided.            Final Clinical Impression(s) / ED Diagnoses Final diagnoses:  None    Rx / DC Orders ED  Discharge Orders     None         Lajean Saver, MD 06/26/22 1350

## 2022-06-25 NOTE — Discharge Instructions (Addendum)
It was our pleasure to provide your ER care today - we hope that you feel better.  Your ct scan was read as showing:1. No evidence for pulmonary embolism. 2. Patchy airspace disease in the left lower lobe and inferior left upper lobe compatible with pneumonia. 3. Single enlarged AP window lymph node, likely reactive. 4. Trace pericardial fluid. 5. Right thyroid nodule measuring 18 mm.   For pneumonia, take antibiotics as prescribed (cefdinir and zithromax).  Follow up closely with primary care doctor in the next 1-2 weeks - discuss incidental CT findings noted above, including  thyroid nodule, and have them arrange follow up imaging. Also have your blood pressure rechecked then as it is high today.  Avoid any smoking or drug use.   Return to ER right away  if worse, new symptoms, recurrent or persistent chest pain, increased trouble breathing, recurrent, persistent or heavy coughing up of blood, or  other concern.

## 2022-06-25 NOTE — ED Notes (Signed)
Lab called critical lactic acid 2.3 and Dr Ashok Cordia was made aware

## 2022-07-12 ENCOUNTER — Ambulatory Visit (HOSPITAL_COMMUNITY)
Admission: EM | Admit: 2022-07-12 | Discharge: 2022-07-12 | Disposition: A | Discharge status: Home / Self Care | Payer: BC Managed Care – PPO

## 2022-07-12 NOTE — Progress Notes (Signed)
   07/12/22 2150  [place] Triage Screening (Walk-ins at [facility] only)  How Did You Hear About [place]? Family/Friend  What Is the Reason for Your Visit/Call Today? Pt is a 66 year old male who presents to [facility] accompanied by his mother, Callahan Wild, and sister, Maryann Conners. He is requesting substance abuse treatment. He reports smoking approximately $500 worth of crack cocaine on weekends, adding he used $120 worth today. 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 He says he also drinks 2-3 shots of liquor daily. He denies other substances use. He denies depressive symptoms, describing his mood as "good." He denies any history of mental health treatment. He says he was in a residential treatment program in the 1990's in [place], [place] called [facility]. He reports his longest period of sobriety is 5 years. He denies current suicidal ideation, homicidal ideation, or psychotic symptoms. Pt says he is looking for residential substance abuse treatment.  How Long Has This Been Causing You Problems? > than 6 months  Have You Recently Had Any Thoughts About Hurting Yourself? No  Are You Planning to Commit Suicide/Harm Yourself At This time? No  Have you Recently Had Thoughts About [place]? No  Are You Planning To Harm Someone At This Time? No  Are you currently experiencing any auditory, visual or other hallucinations? No  Have You Used Any Alcohol or Drugs in the Past 24 Hours? Yes  How long ago did you use Drugs or Alcohol? Today  What Did You Use and How Much? $120 worth of crack cocaine  Do you have any current medical co-morbidities that require immediate attention? No  Clinician description of patient physical appearance/behavior: Pt is casually dressed, alert and oriented x4. Pt speaks in a clear tone, at low volume and normal pace. Motor behavior appears normal. Eye contact is good. Pt's mood is euthymic and affect is congruent with mood.  Thought process is coherent and relevant. There is no indication he is currently responding to internal stimuli or experiencing delusional thought content. He is cooperative.  What Do You Feel Would Help You the Most Today? Alcohol or Drug Use Treatment  If access to [facility] was not available, would you have sought care in the Emergency Department? No  Determination of Need Routine (7 days)  Options For Referral Facility-Based Crisis;Chemical Dependency Intensive Outpatient Therapy (CDIOP)

## 2022-07-12 NOTE — ED Notes (Signed)
Pt declined to see a provider due to him waiting to long. He got some referrals and left from the lobby

## 2022-07-12 NOTE — BH Assessment (Signed)
Pt decided he did not want to wait to see the provider for MSE. He does not present with any imminent safety concerns. Pt was given a list of residential substance abuse treatment facilities. Pt signed [facility] of Medical Screening Exam.   Orpah Greek Anson Fret, [facility], [facility] Triage Specialist [phone]

## 2022-07-18 ENCOUNTER — Other Ambulatory Visit: Payer: Self-pay | Admitting: Internal Medicine

## 2022-07-18 DIAGNOSIS — E1169 Type 2 diabetes mellitus with other specified complication: Secondary | ICD-10-CM

## 2022-08-21 ENCOUNTER — Ambulatory Visit (INDEPENDENT_AMBULATORY_CARE_PROVIDER_SITE_OTHER): Payer: BC Managed Care – PPO | Admitting: Student

## 2022-08-21 VITALS — BP 112/69 | HR 71 | Temp 98.4°F | Wt 192.8 lb

## 2022-08-21 DIAGNOSIS — F1990 Other psychoactive substance use, unspecified, uncomplicated: Secondary | ICD-10-CM | POA: Diagnosis not present

## 2022-08-21 DIAGNOSIS — E785 Hyperlipidemia, unspecified: Secondary | ICD-10-CM

## 2022-08-21 DIAGNOSIS — Z1211 Encounter for screening for malignant neoplasm of colon: Secondary | ICD-10-CM | POA: Diagnosis not present

## 2022-08-21 DIAGNOSIS — E1169 Type 2 diabetes mellitus with other specified complication: Secondary | ICD-10-CM | POA: Diagnosis not present

## 2022-08-21 MED ORDER — FISH OIL 1000 MG PO CAPS: 1000.0000 mg | ORAL_CAPSULE | Freq: Three times a day (TID) | ORAL | 0 refills | Status: AC

## 2022-08-21 MED ORDER — METFORMIN HCL 1000 MG PO TABS: ORAL_TABLET | ORAL | 3 refills | Status: DC

## 2022-08-21 MED ORDER — ROSUVASTATIN CALCIUM 20 MG PO TABS: 20.0000 mg | ORAL_TABLET | Freq: Every day | ORAL | 3 refills | Status: DC

## 2022-08-21 NOTE — Assessment & Plan Note (Signed)
Patient has well-controlled diabetes with A1c stable at 7.1 checked on 08/17/2022.  He continues on metformin 1000 mg twice daily without any issues. - Continue metformin 1000 mg twice daily - Return in 6 months for repeat A1c

## 2022-08-21 NOTE — Progress Notes (Signed)
   CC: Follow-up diabetes  HPI:  Cody Cantrell is a 66 y.o. male with PMH as below who presents to the clinic for follow-up for type 2 diabetes and hyperlipidemia after leaving rehabilitation center.  Past Medical History:  Diagnosis Date   Hyperlipidemia    Type 2 diabetes mellitus (HCC)    Review of Systems:   A comprehensive review of systems was negative.   Physical Exam:  Vitals:   08/21/22 0857  BP: 112/69  Pulse: 71  Temp: 98.4 F (36.9 C)  TempSrc: Oral  SpO2: 98%  Weight: 192 lb 12.8 oz (87.5 kg)    Constitutional: Well-appearing middle-age male. In no acute distress. HENT: Normocephalic, atraumatic,  Eyes: Sclera non-icteric, EOM intact Cardio:Regular rate and rhythm. No murmurs, rubs, or gallops. 2+ bilateral radial pulses. Pulm:Clear to auscultation bilaterally. Normal work of breathing on room air. Abdomen: Soft, non-tender, non-distended, positive bowel sounds. AOZ:HYQMVHQI for extremity edema. Skin:Warm and dry. Neuro:Alert and oriented x3. No focal deficit noted. Psych:Pleasant mood and affect.   Assessment & Plan:   Diabetes mellitus, type 2 (HCC) Patient has well-controlled diabetes with A1c stable at 7.1 checked on 08/17/2022.  He continues on metformin 1000 mg twice daily without any issues. - Continue metformin 1000 mg twice daily - Return in 6 months for repeat A1c  Hyperlipidemia Patient continues on rosuvastatin 20 mg daily and recently had labs checked which included a lipid panel.  LDL on that panel did rise from 49 and 02/2022 to 103.  He may have ran out on the medication for a little bit.  We refilled it today.  Since it has worked in the past we will not make any changes and recheck his lipid panel in about 6 months. 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   He states he is doing well and has a good support system for continuing his sobriety.  During his stay at [facility] he appeared to get an Ativan taper at the beginning and then declined naltrexone therapy.  We will continue to support him in his sobriety and are here for anything he needs.  Screening for colon cancer 66 year old patient without family history of colon cancer or concerning symptoms for cancer.  Discussed screening methods including colonoscopy, FIT testing, Cologuard, and sigmoidoscopy.  He would like to do Cologuard.  I think he is a good candidate for Cologuard and we will order this for him. - Cologuard  Patient seen with Dr. Theodosia Paling, DO [facility] Internal Medicine Resident PGY-1 Pager: [phone]

## 2022-08-21 NOTE — Assessment & Plan Note (Signed)
Patient continues on rosuvastatin 20 mg daily and recently had labs checked which included a lipid panel.  LDL on that panel did rise from 49 and 02/2022 to 103.  He may have ran out on the medication for a little bit.  We refilled it today.  Since it has worked in the past we will not make any changes and recheck his lipid panel in about 6 months. - Continue rosuvastatin 20 mg daily - Return in 6 months for repeat lipid panel

## 2022-08-21 NOTE — Assessment & Plan Note (Signed)
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  He states he is doing well and has a good support system for continuing his sobriety.  During his stay at [facility] he appeared to get an Ativan taper at the beginning and then declined naltrexone therapy.  We will continue to support him in his sobriety and are here for anything he needs.

## 2022-08-21 NOTE — Assessment & Plan Note (Signed)
66 year old patient without family history of colon cancer or concerning symptoms for cancer.  Discussed screening methods including colonoscopy, FIT testing, Cologuard, and sigmoidoscopy.  He would like to do Cologuard.  I think he is a good candidate for Cologuard and we will order this for him. - Cologuard

## 2022-08-21 NOTE — Patient Instructions (Addendum)
  Thank you, [name], for allowing [place] to provide your care today. Today we discussed . . .  > Diabetes       - Your A1c on 08/17/2022 was 7.1 which is great! We won't make any changes in your medications and you will continue on metformin 1000 mg bid.  > High Cholesterol        - Your LDL cholesterol went up to 103 from the labs that were done by [facility].  Our goal is to have your LDL below 100.  We are going to continue on rosuvastatin 20 mg daily and recheck your cholesterol at your next visit. > Colon cancer screening       -We have sent an order for Cologuard and they will send a kit to your house.  If you have any questions about how to use the kit or send it back please do not hesitate to call our office and we can help. > Follow-up from [place]       -It sounds like you are doing great after your stay [facility].  We are here to support you in any way during your sobriety.  If you have any questions, concerns, or request from [place] please do not hesitate to call our office.   I have ordered the following labs for you:  Lab Orders         Cologuard        Tests ordered today:  None   Referrals ordered today:   Referral Orders  No referral(s) requested today      I have ordered the following medication/changed the following medications:   Stop the following medications: Medications Discontinued During This Encounter  Medication Reason   azithromycin (ZITHROMAX Z-PAK) 250 MG tablet Completed Course   cefdinir (OMNICEF) 300 MG capsule Completed Course   mupirocin ointment (BACTROBAN) 2 % No longer needed (for PRN medications)   rosuvastatin (CRESTOR) 20 MG tablet Reorder   metFORMIN (GLUCOPHAGE) 1000 MG tablet Reorder     Start the following medications: Meds ordered this encounter  Medications   Omega-3 Fatty Acids (FISH OIL) 1000 MG CAPS    Sig: Take 1 capsule (1,000 mg total) by mouth 3 (three) times daily.    Refill:  0   metFORMIN  (GLUCOPHAGE) 1000 MG tablet    Sig: TAKE 1 TABLET (1,000 MG TOTAL) BY MOUTH TWICE A DAY WITH FOOD    Dispense:  180 tablet    Refill:  3   rosuvastatin (CRESTOR) 20 MG tablet    Sig: Take 1 tablet (20 mg total) by mouth daily.    Dispense:  90 tablet    Refill:  3      Follow up: 6 months    Remember:     Should you have any questions or concerns please call the internal medicine clinic at [phone].     Cody Cantrell, [place]

## 2022-08-24 NOTE — Progress Notes (Signed)
Internal Medicine Clinic Attending  I saw and evaluated the patient.  I personally confirmed the key portions of the history and exam documented by the resident  and I reviewed pertinent patient test results.  The assessment, diagnosis, and plan were formulated together and I agree with the documentation in the resident's note.  

## 2022-09-19 LAB — COLOGUARD: COLOGUARD: NEGATIVE

## 2022-09-24 NOTE — Progress Notes (Signed)
Called patient to discuss negative Cologuard and recommendation of repeat screening in 3 years (08/2025). All questions were answered.

## 2022-11-03 ENCOUNTER — Other Ambulatory Visit: Payer: Self-pay

## 2022-11-03 DIAGNOSIS — Z125 Encounter for screening for malignant neoplasm of prostate: Secondary | ICD-10-CM

## 2022-11-06 ENCOUNTER — Other Ambulatory Visit: Payer: Self-pay | Admitting: *Deleted

## 2022-11-06 DIAGNOSIS — Z125 Encounter for screening for malignant neoplasm of prostate: Secondary | ICD-10-CM

## 2022-11-06 NOTE — Progress Notes (Signed)
Patient: Cody Cantrell           Date of Birth: [birth year]           MRN: [id number] Visit Date: 11/06/2022 PCP: Idamae Schuller, MD  Prostate Cancer Screening Date of last physical exam: 08/19/22 Date of last rectal exam:  (Unknown) Have you ever had any of the following?: None Have you ever had or been told you have an allergy to latex products?: No Are you currently taking any natural prostate preparations?: No Are you currently experiencing any urinary symptoms?: No  Prostate Exam Exam not completed. PSA only completed.  Patient's History Patient Active Problem List   Diagnosis Date Noted   Substance use disorder 08/21/2022   NAFL (nonalcoholic fatty liver) 123456   Diabetes mellitus, type 2 ([place]) 08/30/2021   Hyperlipidemia 08/30/2021   Screening for colon cancer 08/30/2021   Past Medical History:  Diagnosis Date   Hyperlipidemia    Type 2 diabetes mellitus ([place])     No family history on file.  Social History   Occupational History   Not on file  Tobacco Use   Smoking status: Every Day    Packs/day: 0.10    Types: Cigarettes   Smokeless tobacco: Not on file   Tobacco comments:    1 pk per week   Substance and Sexual Activity   Alcohol use: Yes    Comment: occ   Drug use: No   Sexual activity: Not on file

## 2022-11-07 LAB — PSA: Prostate Specific Ag, Serum: 0.3 ng/mL (ref 0.0–4.0)

## 2022-11-09 ENCOUNTER — Telehealth: Payer: Self-pay

## 2022-11-09 NOTE — Telephone Encounter (Signed)
Patient informed normal PSA results, recheck in 1 year, verbalized understanding.

## 2023-03-29 IMAGING — CR DG CHEST 2V
2 series · 2 of 2 positions shown · non-contrast
Comparison: 02/26/2014

CLINICAL DATA: Hemoptysis

EXAM:
CHEST - 2 VIEW

[w chest pa]
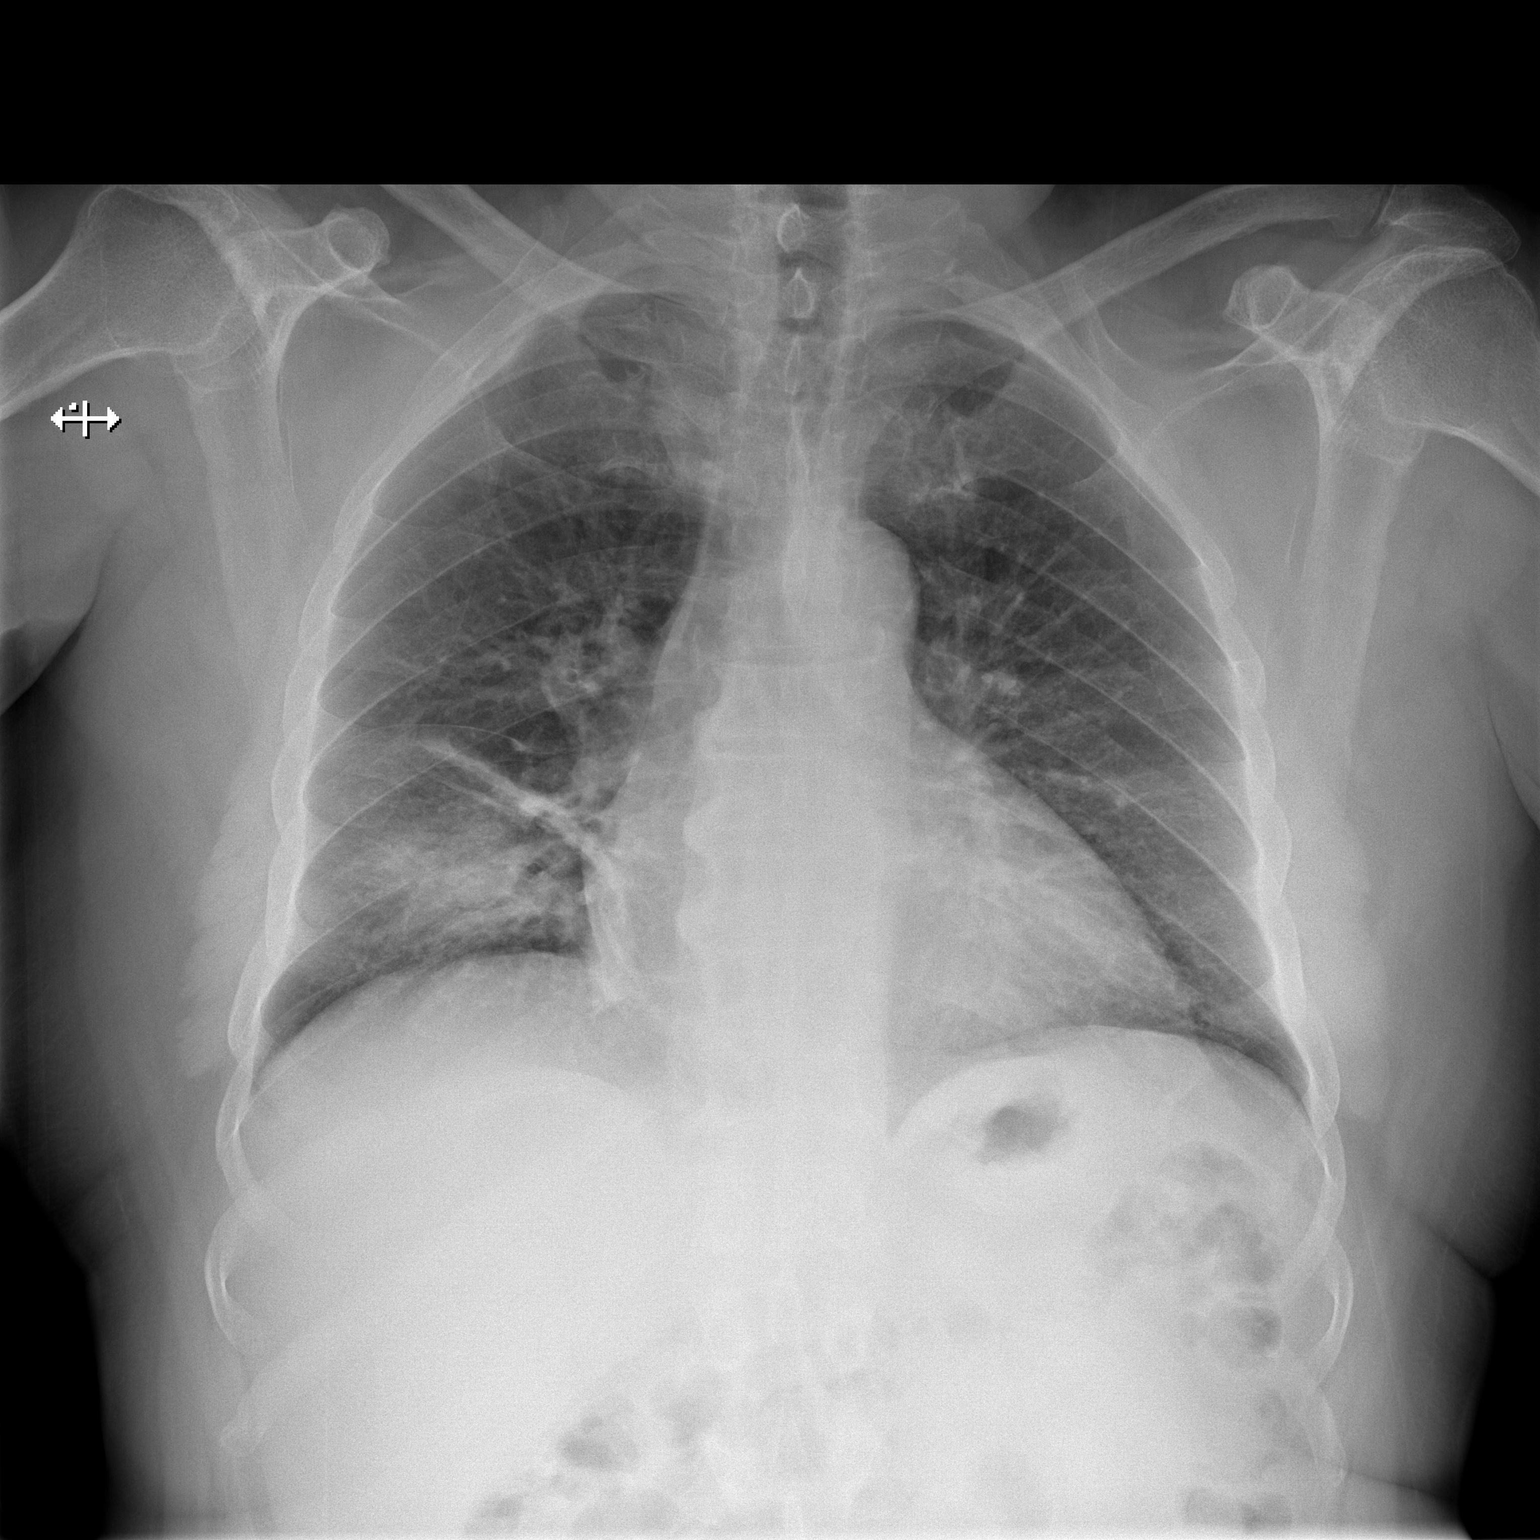

[w chest lat]
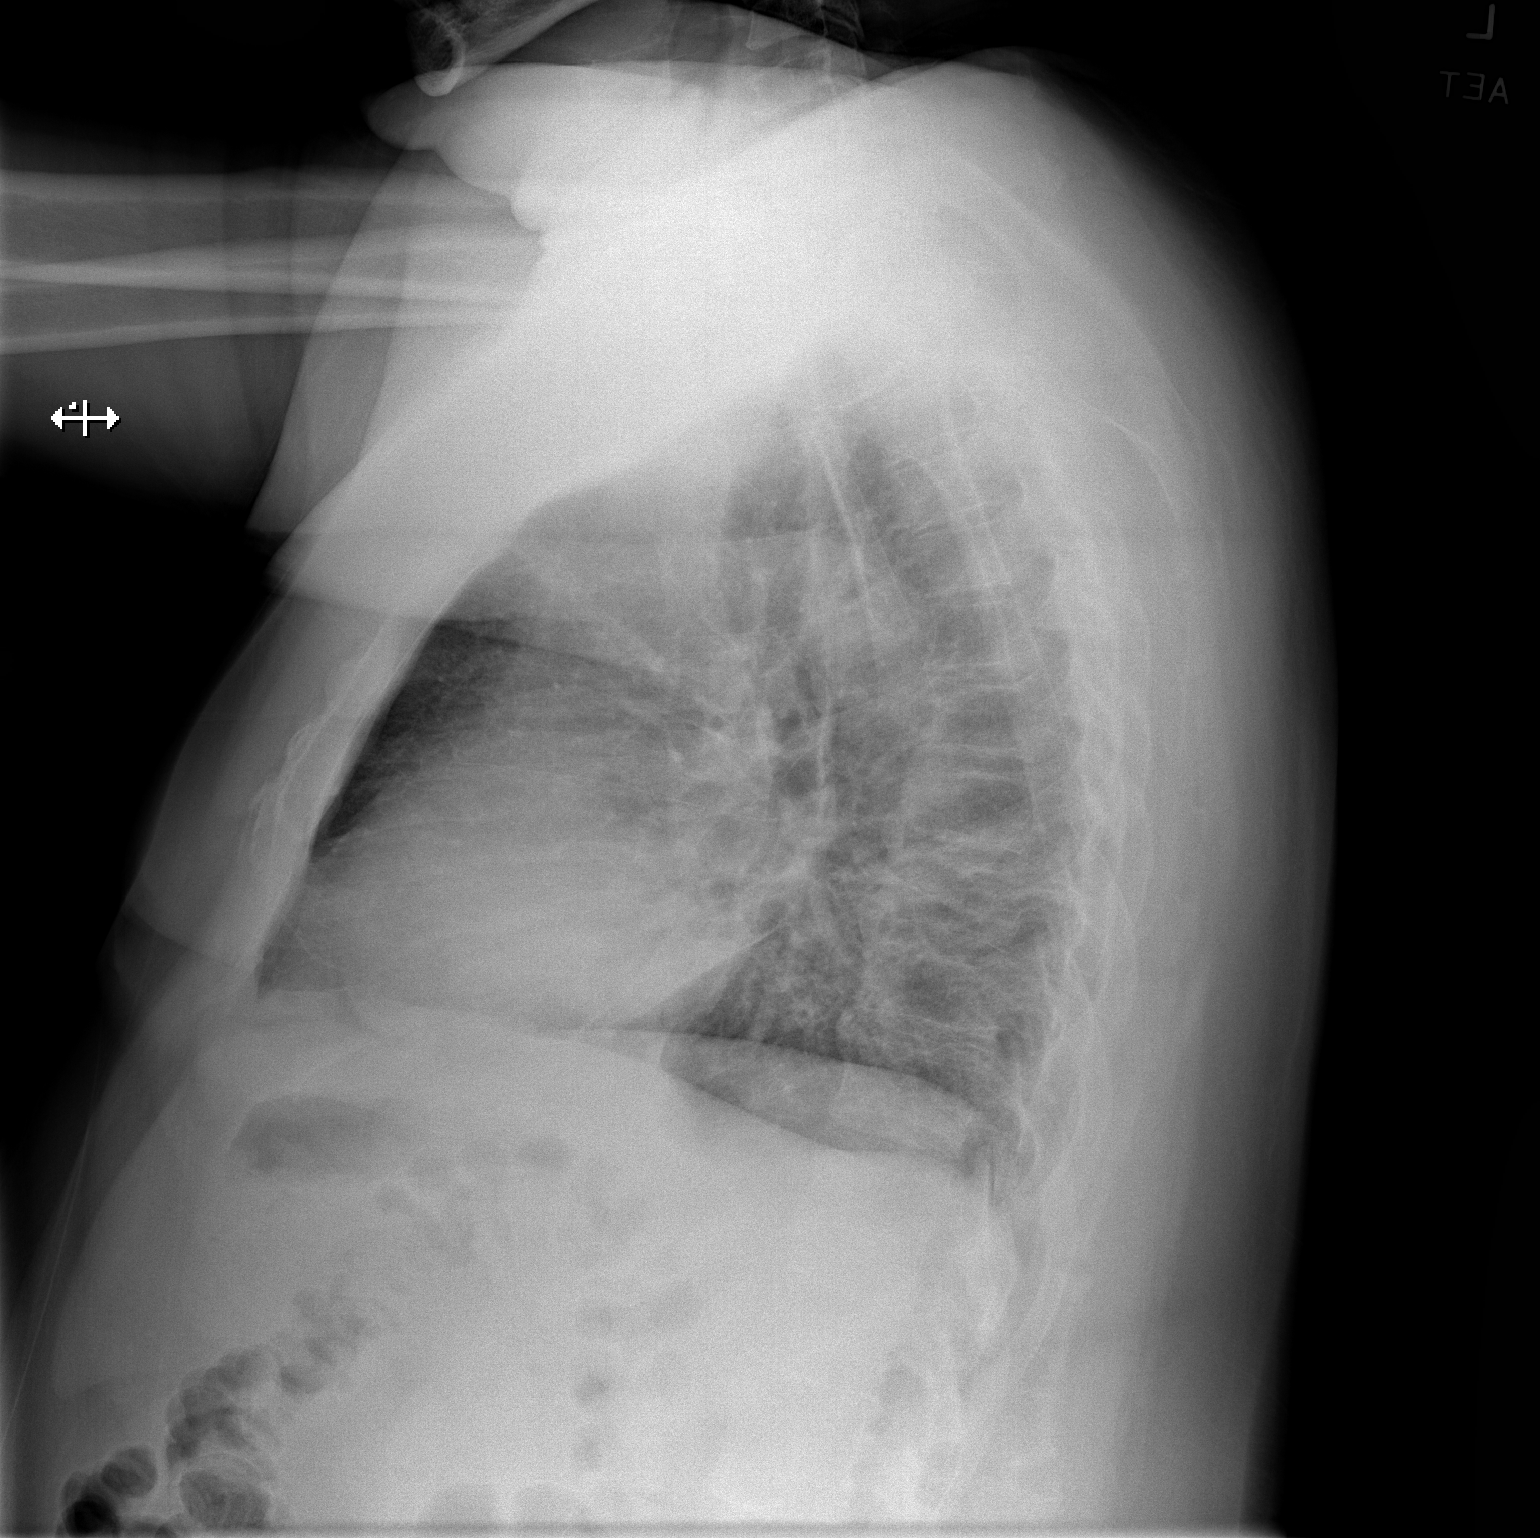

[2 of 2 positions shown; findings below may reference images not displayed]

FINDINGS: Mild cardiomegaly. Heterogeneous airspace opacity of the right
middle lobe with additional bandlike atelectasis or scarring. Disc
degenerative disease of the thoracic spine.
IMPRESSION: 1. Heterogeneous airspace opacity of the right middle lobe with
additional bandlike atelectasis or scarring. This most likely
reflects infection, however underlying mass is difficult to exclude.
At minimum, recommend follow-up radiographs in 6-8 weeks to ensure
complete radiographic resolution.
2. Mild cardiomegaly.

## 2023-05-05 IMAGING — US US ABDOMEN LIMITED
1 series · 14 of 25 positions shown · non-contrast
Comparison: None.

CLINICAL DATA: Transaminitis

EXAM:
ULTRASOUND ABDOMEN LIMITED RIGHT UPPER QUADRANT

[Series 1: us abdomen limited · 0.19mm/px · 14 of 42 slices shown]
[im 1/42]
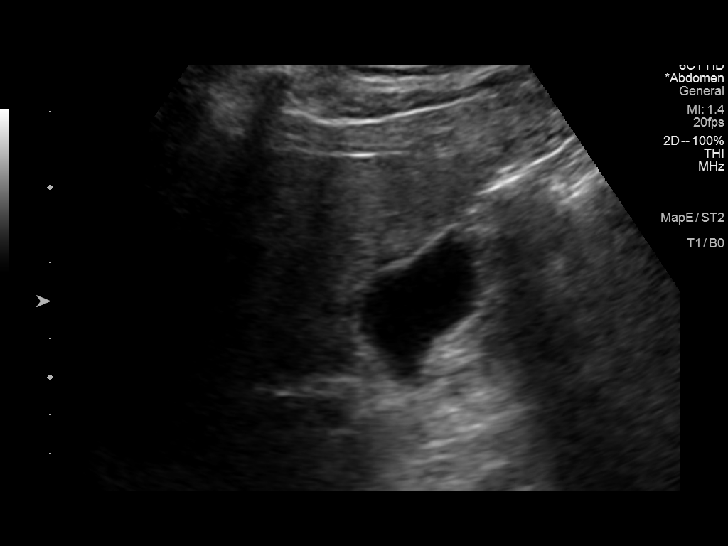
[im 4/42]
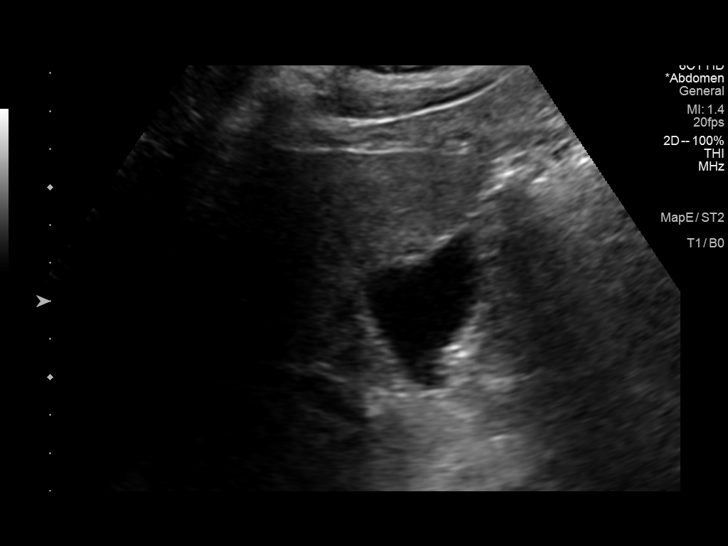
[im 7/42]
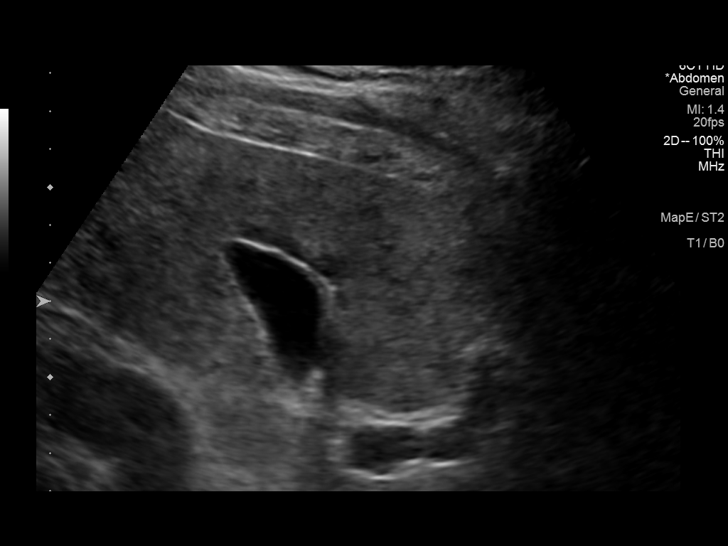
[im 11/42]
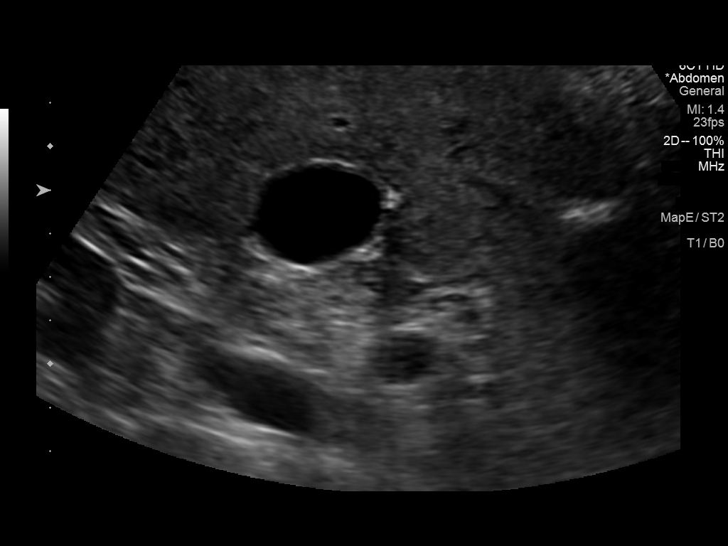
[im 14/42]
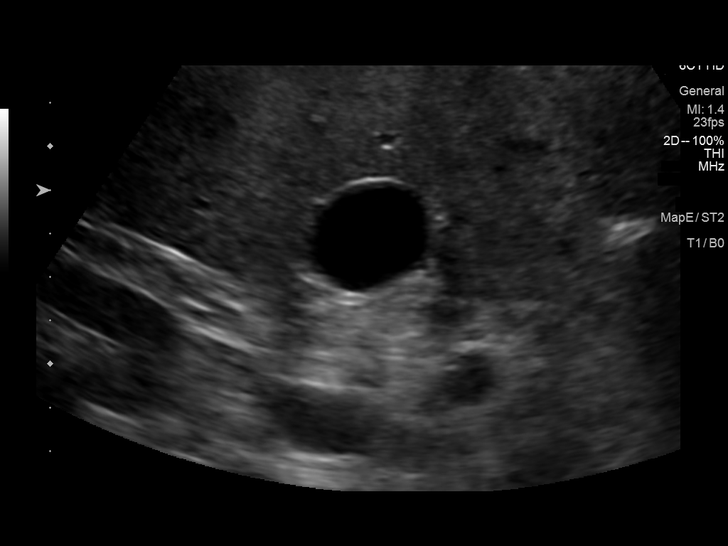
[im 16/42]
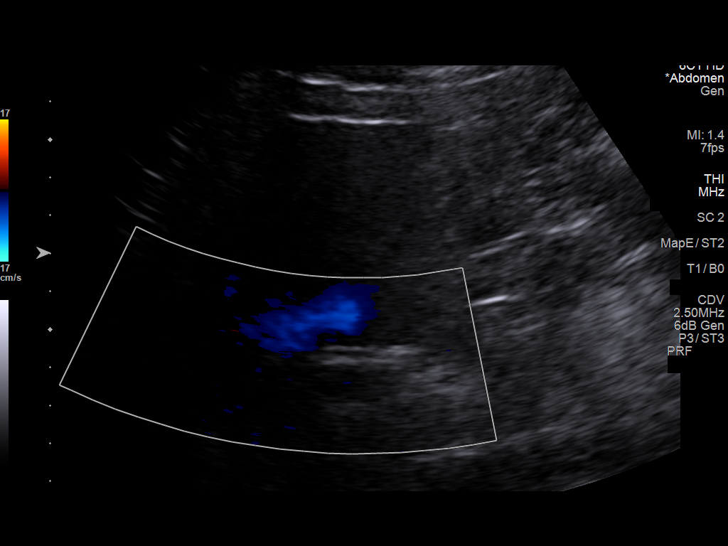
[im 19/42]
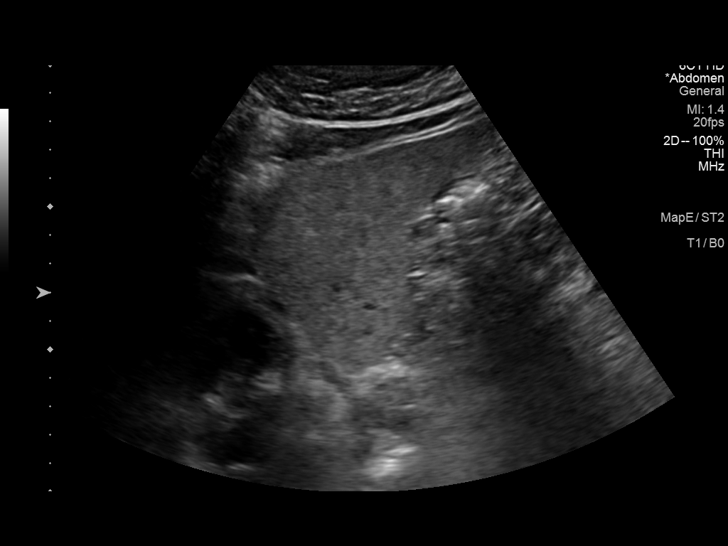
[im 23/42]
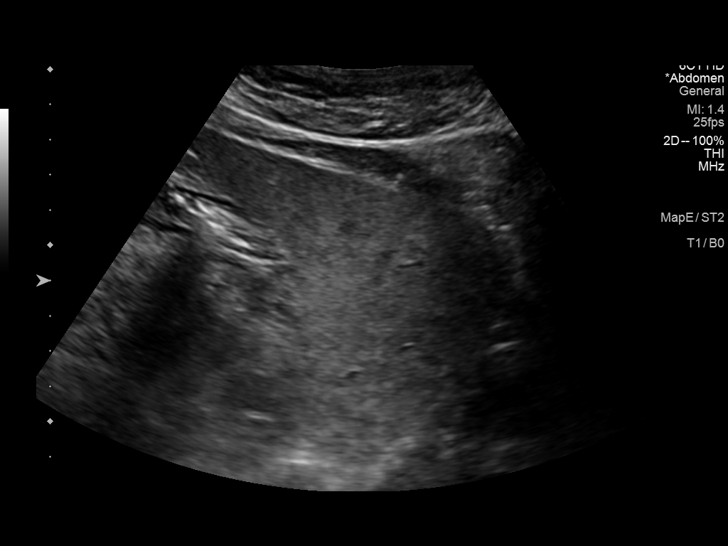
[im 26/42]
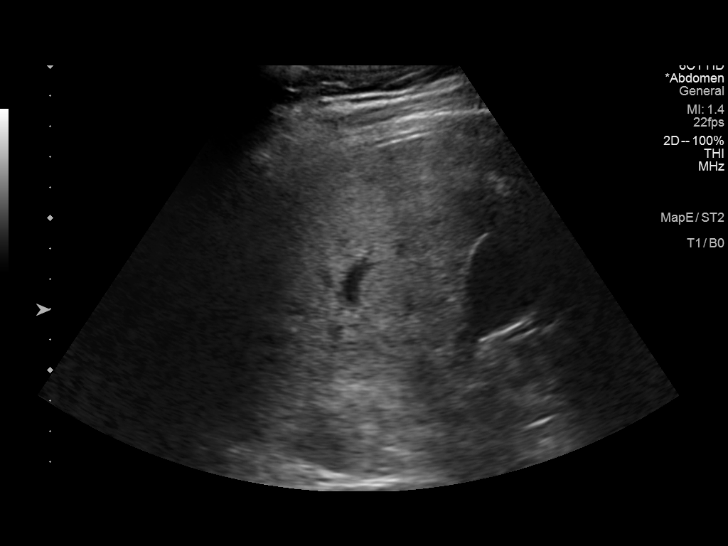
[im 28/42]
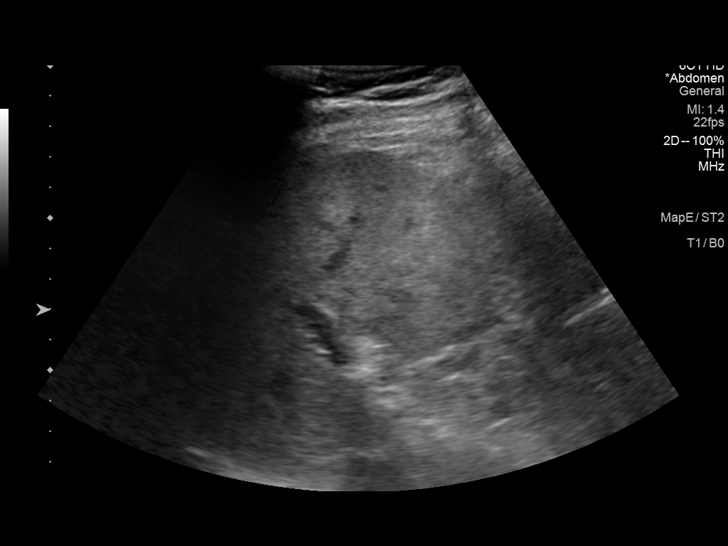
[im 31/42]
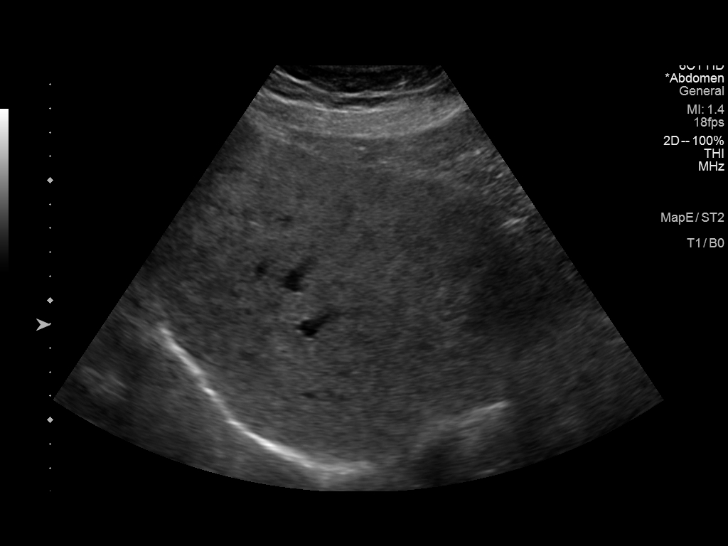
[im 35/42]
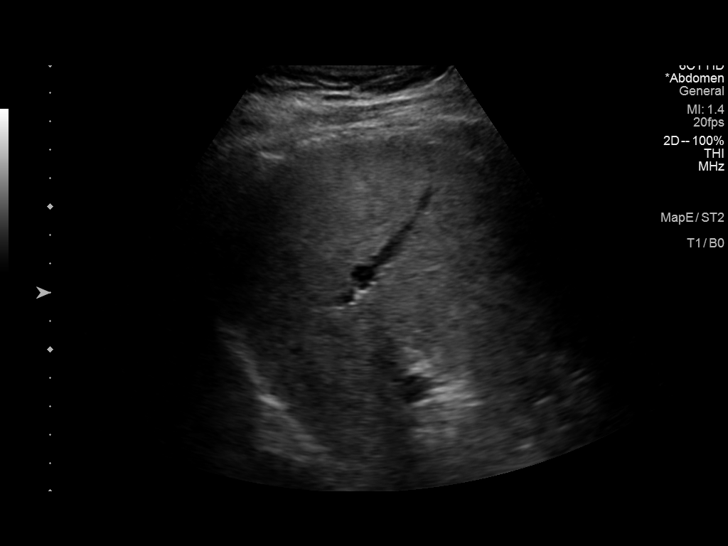
[im 38/42]
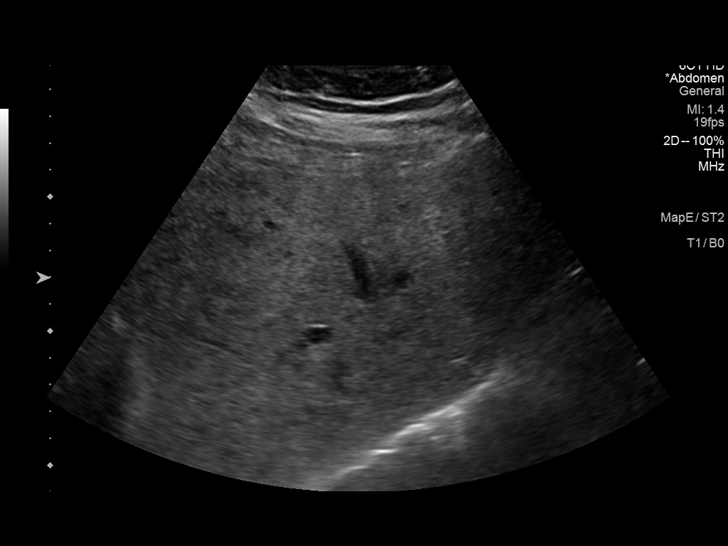
[im 42/42]
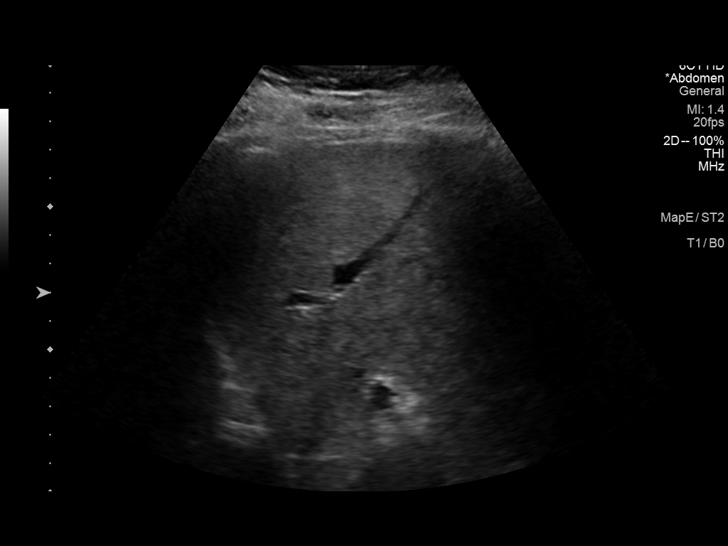

[14 of 25 positions shown; findings below may reference images not displayed]

FINDINGS: Gallbladder:

No gallstones or wall thickening visualized. No sonographic Murphy
sign noted by sonographer.

Common bile duct:

Diameter: 3 mm

Liver:

No focal lesion.

Diffusely increased parenchymal echogenicity.

Portal vein is patent on color Doppler imaging with normal direction
of blood flow towards the liver.

Other: None.
IMPRESSION: Diffuse increased echogenicity of the hepatic parenchyma is a
nonspecific indicator of hepatocellular dysfunction, most commonly
steatosis.

## 2023-05-13 IMAGING — CR DG CHEST 2V
2 series · 2 of 2 positions shown · non-contrast
Comparison: Chest x-ray dated August 23, 2021.

CLINICAL DATA: Pneumonia.

EXAM:
CHEST - 2 VIEW

[chest pa]
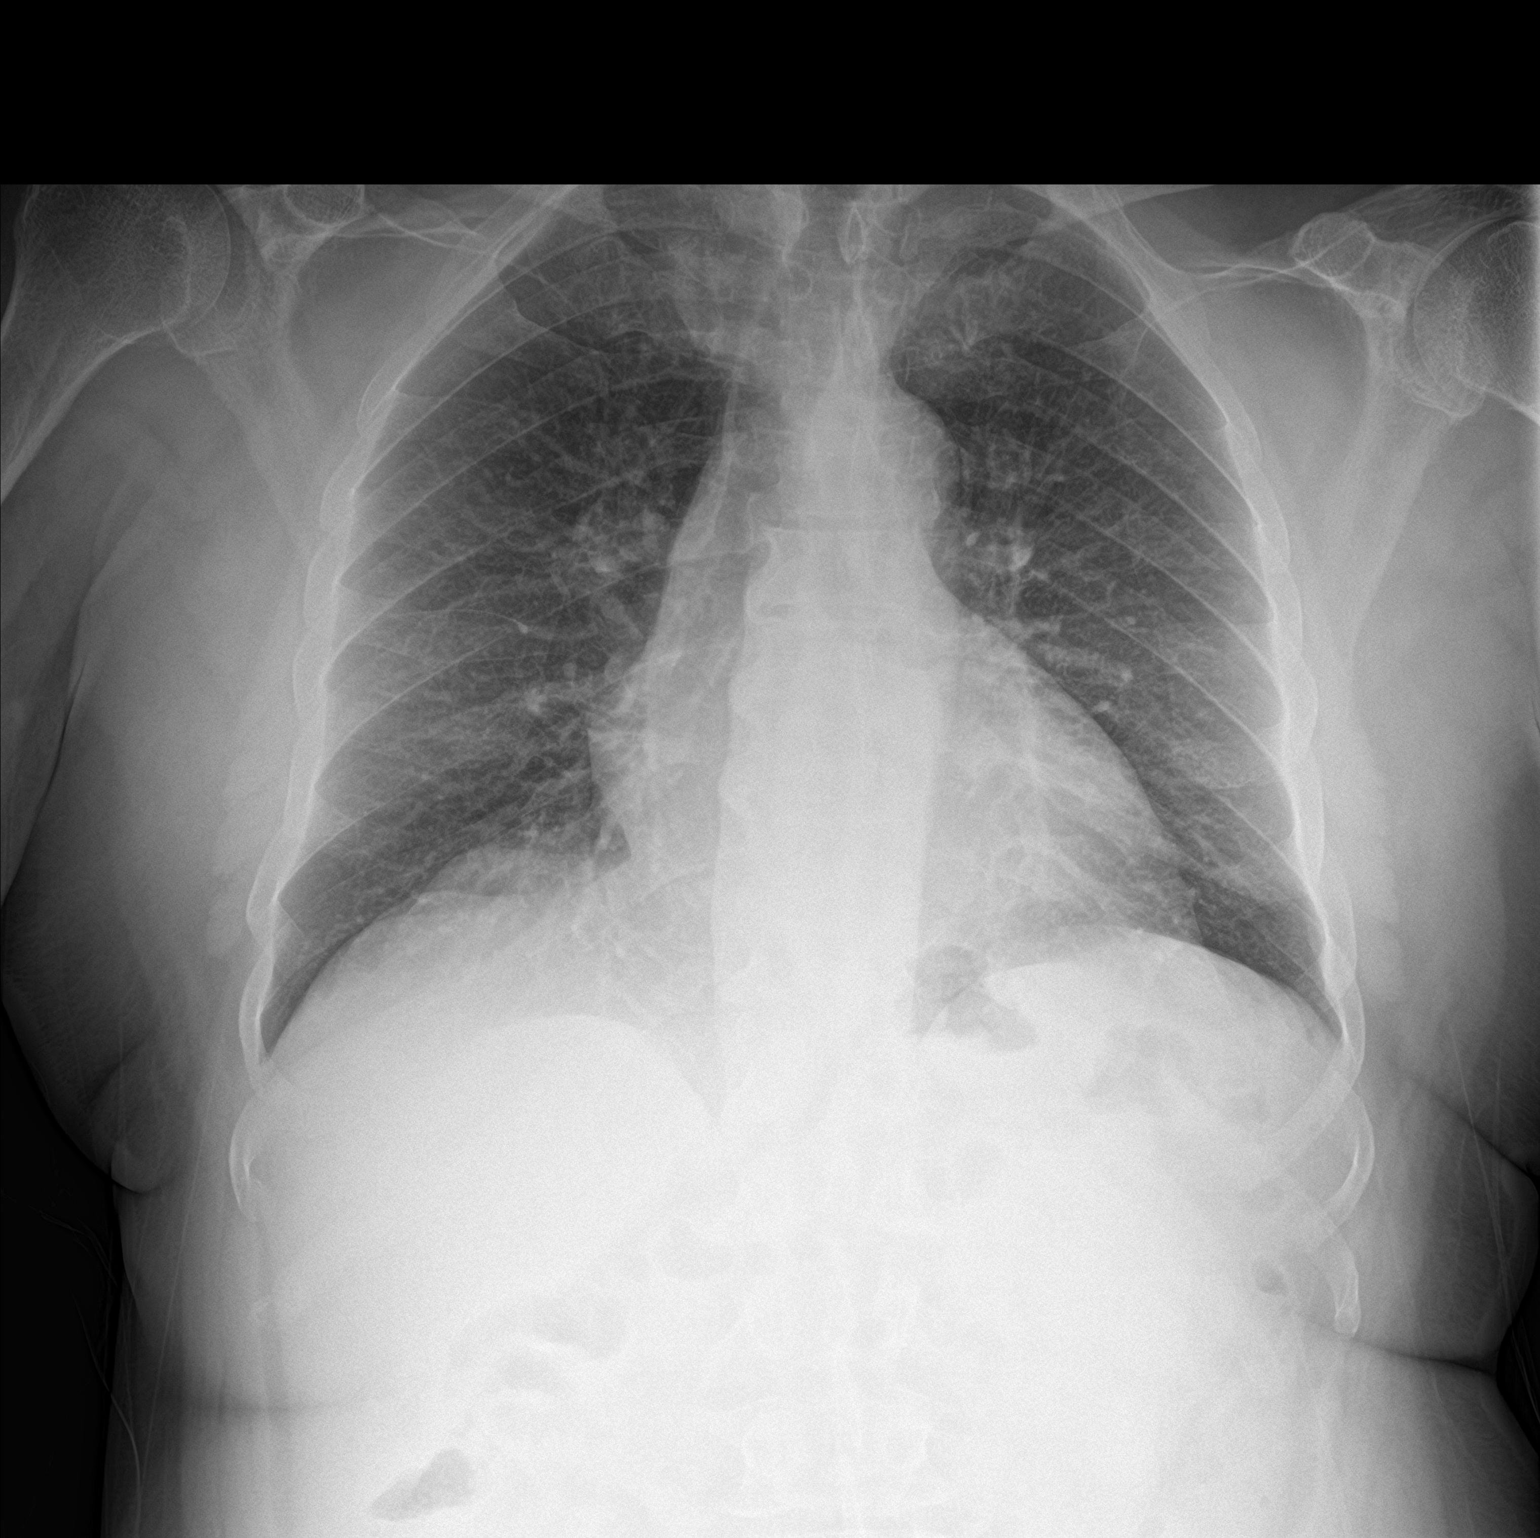

[chest lat]
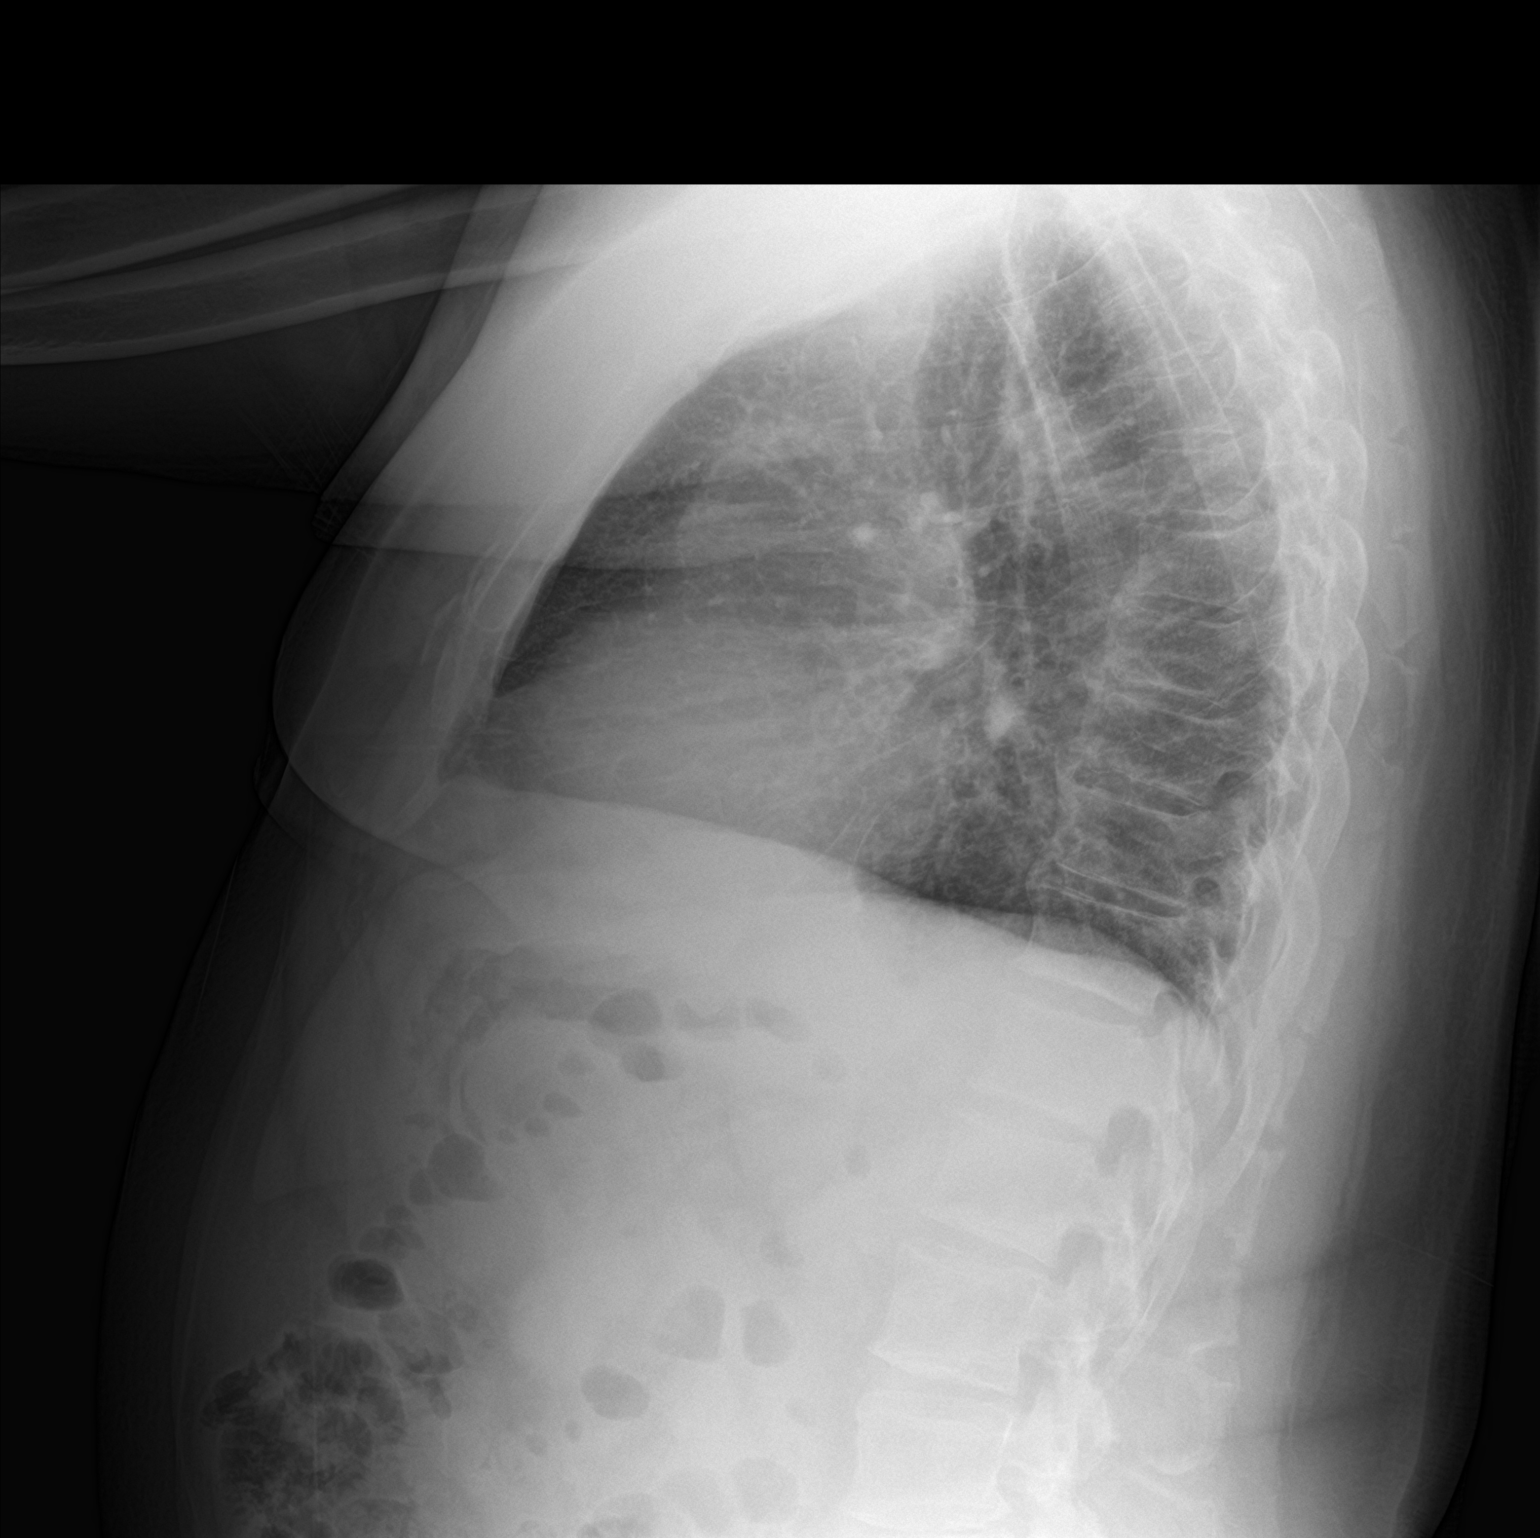

[2 of 2 positions shown; findings below may reference images not displayed]

FINDINGS: The heart size and mediastinal contours are within normal limits.
Normal pulmonary vascularity. Resolved consolidation in the right
middle lobe. No pleural effusion or pneumothorax. No acute osseous
abnormality.
IMPRESSION: 1. Resolved right middle lobe pneumonia.

## 2023-07-01 ENCOUNTER — Encounter: Payer: Self-pay | Admitting: Student

## 2023-07-01 ENCOUNTER — Ambulatory Visit: Payer: BC Managed Care – PPO | Admitting: Student

## 2023-07-01 VITALS — BP 116/82 | HR 57 | Temp 98.1°F | Ht 67.0 in | Wt 189.3 lb

## 2023-07-01 DIAGNOSIS — F1990 Other psychoactive substance use, unspecified, uncomplicated: Secondary | ICD-10-CM | POA: Diagnosis not present

## 2023-07-01 DIAGNOSIS — E785 Hyperlipidemia, unspecified: Secondary | ICD-10-CM | POA: Diagnosis not present

## 2023-07-01 DIAGNOSIS — Z7984 Long term (current) use of oral hypoglycemic drugs: Secondary | ICD-10-CM

## 2023-07-01 DIAGNOSIS — Z23 Encounter for immunization: Secondary | ICD-10-CM

## 2023-07-01 DIAGNOSIS — F17210 Nicotine dependence, cigarettes, uncomplicated: Secondary | ICD-10-CM

## 2023-07-01 DIAGNOSIS — Z0000 Encounter for general adult medical examination without abnormal findings: Secondary | ICD-10-CM | POA: Insufficient documentation

## 2023-07-01 DIAGNOSIS — Z720 Tobacco use: Secondary | ICD-10-CM

## 2023-07-01 DIAGNOSIS — E119 Type 2 diabetes mellitus without complications: Secondary | ICD-10-CM

## 2023-07-01 DIAGNOSIS — E1169 Type 2 diabetes mellitus with other specified complication: Secondary | ICD-10-CM | POA: Diagnosis not present

## 2023-07-01 DIAGNOSIS — K760 Fatty (change of) liver, not elsewhere classified: Secondary | ICD-10-CM

## 2023-07-01 LAB — POCT GLYCOSYLATED HEMOGLOBIN (HGB A1C): Hemoglobin A1C: 6.2 % — AB (ref 4.0–5.6)

## 2023-07-01 LAB — GLUCOSE, CAPILLARY: Glucose-Capillary: 88 mg/dL (ref 70–99)

## 2023-07-01 MED ORDER — NICOTINE 14 MG/24HR TD PT24: 14.0000 mg | MEDICATED_PATCH | TRANSDERMAL | 2 refills | Status: AC

## 2023-07-01 MED ORDER — ROSUVASTATIN CALCIUM 20 MG PO TABS: 20.0000 mg | ORAL_TABLET | Freq: Every day | ORAL | 3 refills | Status: AC

## 2023-07-01 NOTE — Assessment & Plan Note (Signed)
Current regiment of metformin 1 g twice daily.  A1c improved today from 7.0 to 6.2%.  Very pleased with his A1c trend. Plan: Micro albumin creatinine ratio Ophthalmology referral for yearly eye exam CMP today to assess for kidney function, LFTs in setting of history of metabolic associated steatohepatitis Diabetic foot exam today

## 2023-07-01 NOTE — Progress Notes (Signed)
    Subjective:  CC: Follow-up, diabetes  HPI:  [name] is a 67 y.o. person with a past medical history stated below and presents today for routine follow-up. Please see problem based assessment and plan for additional details.  Past Medical History:  Diagnosis Date   Hyperlipidemia    Type 2 diabetes mellitus (HCC)     Current Outpatient Medications on File Prior to Visit  Medication Sig Dispense Refill   EPINEPHrine 0.3 mg/0.3 mL IJ SOAJ injection Inject 0.3 mg into the muscle as needed for anaphylaxis. 1 each 0   metFORMIN (GLUCOPHAGE) 1000 MG tablet TAKE 1 TABLET (1,000 MG TOTAL) BY MOUTH TWICE A DAY WITH FOOD 180 tablet 3   Omega-3 Fatty Acids (FISH OIL) 1000 MG CAPS Take 1 capsule (1,000 mg total) by mouth 3 (three) times daily.  0   No current facility-administered medications on file prior to visit.    Review of Systems: Please see assessment and plan for pertinent positives and negatives.  Objective:   Vitals:   07/01/23 0858  BP: 116/82  Pulse: (!) 57  Temp: 98.1 F (36.7 C)  TempSrc: Oral  SpO2: 99%  Weight: 189 lb 4.8 oz (85.9 kg)  Height: 5\' 7"  (1.702 m)    Physical Exam: Constitutional: Well-appearing, in no acute distress Cardiovascular: regular rate and rhythm, no m/r/g Pulmonary/Chest: normal work of breathing on room air, lungs clear to auscultation bilaterally Abdominal: soft, non-tender, non-distended Extremities: No edema of the lower extremities bilaterally.  Palpable dorsalis pedis and posterior tibialis pulses bilaterally. Skin: warm and dry Psych: Pleasant mood and affect   Assessment & Plan:  Healthcare maintenance Healthcare Maintenance: Colonoscopy: Cologaurd negative 09/2022. Repeat 3 years.  TDAP: Patient states that he had a year ago A1C:   6.2 today Lipid: Normal lest check on Crestor    Diabetes mellitus, type 2 (HCC) Current regiment of metformin 1 g twice daily.  A1c improved today from 7.0 to 6.2%.  Very  pleased with his A1c trend. Plan: Micro albumin creatinine ratio Ophthalmology referral for yearly eye exam CMP today to assess for kidney function, LFTs in setting of history of metabolic associated steatohepatitis Diabetic foot exam today   Hyperlipidemia Has been out of his statin for about 2 weeks.  LDL normal on previous blood work. Plan: Will hold off on lipid panel at this visit as he has been off his Crestor.  Well-controlled in past. Refill Crestor   Substance use disorder History of substance use disorder, attended a rehabilitation program for 30 days at the end of 2023.  He says he has been able to maintain his sobriety since then.  Congratulated the patient on this  Tobacco abuse History of tobacco abuse, smoking about half a pack a day.  Discussed at length with the patient tobacco cessation, he is motivated to try and cut down on his smoking.  This will be the best thing he can do for his health moving forward.  Not interested in Chantix at this time due to fear of side effects.  He will attempt different techniques on his own to cut back on his smoking. Plan: Nicotine patches prescribed IBH referral    Patient seen with Dr. Elliot Cousin MD [facility] Internal Medicine  PGY-1 Pager: [phone]  Phone: [phone] Date 07/01/2023  Time 11:38 AM

## 2023-07-01 NOTE — Assessment & Plan Note (Signed)
History of substance use disorder, attended a rehabilitation program for 30 days at the end of 2023.  He says he has been able to maintain his sobriety since then.  Congratulated the patient on this

## 2023-07-01 NOTE — Assessment & Plan Note (Addendum)
Healthcare Maintenance: Colonoscopy: Cologaurd negative 09/2022. Repeat 3 years.  TDAP: Patient states that he had a year ago A1C:   6.2 today Lipid: Normal lest check on Crestor

## 2023-07-01 NOTE — Assessment & Plan Note (Signed)
Has been out of his statin for about 2 weeks.  LDL normal on previous blood work. Plan: Will hold off on lipid panel at this visit as he has been off his Crestor.  Well-controlled in past. Refill Crestor

## 2023-07-01 NOTE — Assessment & Plan Note (Addendum)
History of tobacco abuse, smoking about half a pack a day.  Discussed at length with the patient tobacco cessation, he is motivated to try and cut down on his smoking.  This will be the best thing he can do for his health moving forward.  Not interested in Chantix at this time due to fear of side effects.  He will attempt different techniques on his own to cut back on his smoking. Plan: Nicotine patches prescribed IBH referral

## 2023-07-01 NOTE — Patient Instructions (Signed)
Thank you, Mr.Keymarion B Stopka for allowing [place] to provide your care today.  I have ordered the following tests for you:   Lab Orders         Glucose, capillary         Microalbumin / Creatinine Urine Ratio         CMP14 + Anion Gap         POC Hbg A1C       Referrals ordered today:    Referral Orders         Ambulatory referral to Ophthalmology         Ambulatory referral to Integrated Behavioral Health      I have ordered the following medication/changed the following medications:   Stop the following medications: Medications Discontinued During This Encounter  Medication Reason   rosuvastatin (CRESTOR) 20 MG tablet Reorder     Start the following medications: Meds ordered this encounter  Medications   rosuvastatin (CRESTOR) 20 MG tablet    Sig: Take 1 tablet (20 mg total) by mouth daily.    Dispense:  90 tablet    Refill:  3   nicotine (NICODERM CQ - DOSED IN MG/24 HOURS) 14 mg/24hr patch    Sig: Place 1 patch (14 mg total) onto the skin daily.    Dispense:  30 patch    Refill:  2       Follow up: 6 months - 12 months for Follow up   We look forward to seeing you next time. Please call our clinic at [phone] if you have any questions or concerns. The best time to call is Monday-Friday from 9am-4pm, but there is someone available 24/7. If after hours or the weekend, call the main hospital number and ask for the Internal Medicine Resident On-Call. If you need medication refills, please notify your pharmacy one week in advance and they will send [place] a request.   Thank you for trusting me with your care. Wishing you the best!  Lovie Macadamia MD [facility]

## 2023-07-02 LAB — MICROALBUMIN / CREATININE URINE RATIO
Creatinine, Urine: 55.7 mg/dL
Microalb/Creat Ratio: 8 mg/g{creat} (ref 0–29)
Microalbumin, Urine: 4.2 ug/mL

## 2023-07-03 LAB — CMP14 + ANION GAP
ALT: 19 [IU]/L (ref 0–44)
AST: 22 [IU]/L (ref 0–40)
Albumin: 4.2 g/dL (ref 3.9–4.9)
Alkaline Phosphatase: 76 [IU]/L (ref 44–121)
Anion Gap: 17 mmol/L (ref 10.0–18.0)
BUN/Creatinine Ratio: 10 (ref 10–24)
BUN: 10 mg/dL (ref 8–27)
Bilirubin Total: <0.2 mg/dL (ref 0.0–1.2)
CO2: 22 mmol/L (ref 20–29)
Calcium: 9.3 mg/dL (ref 8.6–10.2)
Chloride: 100 mmol/L (ref 96–106)
Creatinine, Ser: 0.97 mg/dL (ref 0.76–1.27)
Globulin, Total: 2.5 g/dL (ref 1.5–4.5)
Glucose: 94 mg/dL (ref 70–99)
Potassium: 4.2 mmol/L (ref 3.5–5.2)
Sodium: 139 mmol/L (ref 134–144)
Total Protein: 6.7 g/dL (ref 6.0–8.5)
eGFR: 87 mL/min/{1.73_m2} (ref >59)

## 2023-07-05 ENCOUNTER — Other Ambulatory Visit: Payer: Self-pay

## 2023-07-05 DIAGNOSIS — Z125 Encounter for screening for malignant neoplasm of prostate: Secondary | ICD-10-CM

## 2023-07-05 NOTE — Progress Notes (Signed)
Internal Medicine Clinic Attending  I was physically present during the key portions of the resident provided service and participated in the medical decision making of patient's management care. I reviewed pertinent patient test results.  The assessment, diagnosis, and plan were formulated together and I agree with the documentation in the resident's note.  Mercie Eon, MD     I think smoking cessation will be one of the best things Mr Kargbo can do for his health - Dr Jeannie Fend and I counseled him on this today

## 2023-07-06 ENCOUNTER — Telehealth: Payer: Self-pay

## 2023-07-06 NOTE — Telephone Encounter (Signed)
Returned patient call, labs discussed.

## 2023-07-06 NOTE — Telephone Encounter (Signed)
Requesting lab results, please call pt back.

## 2023-07-09 ENCOUNTER — Other Ambulatory Visit: Payer: Self-pay | Admitting: *Deleted

## 2023-07-09 DIAGNOSIS — Z125 Encounter for screening for malignant neoplasm of prostate: Secondary | ICD-10-CM

## 2023-07-09 NOTE — Progress Notes (Signed)
Patient: Cody Cantrell           Date of Birth: 10/19/1955           MRN: [id number] Visit Date: 07/09/2023 PCP: Gwenevere Abbot, MD  Prostate Cancer Screening Date of last physical exam:  (4 years ago) Date of last rectal exam:  (Unknown) Have you ever had any of the following?: None Have you ever had or been told you have an allergy to latex products?: No Are you currently taking any natural prostate preparations?: No Are you currently experiencing any urinary symptoms?: No  Prostate Exam Exam not completed. PSA blood test completed only.  Patient's History Patient Active Problem List   Diagnosis Date Noted   Healthcare maintenance 07/01/2023   Tobacco abuse 07/01/2023   Substance use disorder 08/21/2022   NAFL (nonalcoholic fatty liver) 09/29/2021   Diabetes mellitus, type 2 (HCC) 08/30/2021   Hyperlipidemia 08/30/2021   Screening for colon cancer 08/30/2021   Past Medical History:  Diagnosis Date   Hyperlipidemia    Type 2 diabetes mellitus (HCC)     No family history on file.  Social History   Occupational History   Not on file  Tobacco Use   Smoking status: Every Day    Current packs/day: 0.10    Types: Cigarettes   Smokeless tobacco: Not on file   Tobacco comments:    1 pk per week   Substance and Sexual Activity   Alcohol use: Yes    Comment: occ   Drug use: No   Sexual activity: Not on file

## 2023-07-10 LAB — PSA: Prostate Specific Ag, Serum: 0.4 ng/mL (ref 0.0–4.0)

## 2023-09-22 ENCOUNTER — Other Ambulatory Visit: Payer: Self-pay | Admitting: Student

## 2023-09-22 DIAGNOSIS — E1169 Type 2 diabetes mellitus with other specified complication: Secondary | ICD-10-CM

## 2023-10-13 LAB — HM DIABETES EYE EXAM

## 2023-11-02 ENCOUNTER — Encounter: Payer: Self-pay | Admitting: Dietician

## 2024-03-06 ENCOUNTER — Encounter: Payer: Self-pay | Admitting: *Deleted
